# Patient Record
Sex: Female | Born: 1980 | Marital: Married | Smoking: Never smoker
Health system: Southern US, Community
[De-identification: ages and names within clinical notes are randomized; demographics above are authoritative.]

## PROBLEM LIST (undated history)

## (undated) ENCOUNTER — Emergency Department: Discharge status: Absent without leave | Payer: Self-pay | Attending: Emergency Medicine | Admitting: Emergency Medicine

## (undated) DIAGNOSIS — D219 Benign neoplasm of connective and other soft tissue, unspecified: Secondary | ICD-10-CM

## (undated) DIAGNOSIS — R87629 Unspecified abnormal cytological findings in specimens from vagina: Secondary | ICD-10-CM

## (undated) DIAGNOSIS — D62 Acute posthemorrhagic anemia: Secondary | ICD-10-CM

## (undated) DIAGNOSIS — N739 Female pelvic inflammatory disease, unspecified: Secondary | ICD-10-CM

## (undated) HISTORY — DX: Benign neoplasm of connective and other soft tissue, unspecified: D21.9

## (undated) HISTORY — PX: WISDOM TOOTH EXTRACTION: SHX21

## (undated) HISTORY — PX: DILATION AND CURETTAGE OF UTERUS: SHX78

## (undated) HISTORY — DX: Unspecified abnormal cytological findings in specimens from vagina: R87.629

---

## 2010-10-20 ENCOUNTER — Ambulatory Visit
Admission: RE | Admit: 2010-10-20 | Discharge: 2010-10-20 | Disposition: A | Discharge status: Home / Self Care | Payer: Commercial Managed Care - PPO | Source: Ambulatory Visit

## 2010-10-20 ENCOUNTER — Other Ambulatory Visit: Payer: Self-pay

## 2010-10-20 DIAGNOSIS — R7611 Nonspecific reaction to tuberculin skin test without active tuberculosis: Secondary | ICD-10-CM

## 2011-01-04 ENCOUNTER — Other Ambulatory Visit: Payer: Self-pay | Admitting: Infectious Diseases

## 2011-01-04 DIAGNOSIS — R7611 Nonspecific reaction to tuberculin skin test without active tuberculosis: Secondary | ICD-10-CM

## 2012-01-09 ENCOUNTER — Encounter: Payer: Self-pay | Admitting: Obstetrics and Gynecology

## 2012-01-30 ENCOUNTER — Encounter: Payer: Self-pay | Admitting: Obstetrics and Gynecology

## 2012-08-27 DIAGNOSIS — N979 Female infertility, unspecified: Secondary | ICD-10-CM | POA: Insufficient documentation

## 2012-10-22 ENCOUNTER — Other Ambulatory Visit (HOSPITAL_COMMUNITY): Payer: Self-pay | Admitting: Obstetrics and Gynecology

## 2012-10-22 DIAGNOSIS — N979 Female infertility, unspecified: Secondary | ICD-10-CM

## 2012-10-25 ENCOUNTER — Ambulatory Visit (HOSPITAL_COMMUNITY): Payer: Self-pay

## 2012-12-11 ENCOUNTER — Other Ambulatory Visit (HOSPITAL_COMMUNITY): Payer: Self-pay | Admitting: Obstetrics and Gynecology

## 2012-12-11 DIAGNOSIS — N979 Female infertility, unspecified: Secondary | ICD-10-CM

## 2012-12-21 ENCOUNTER — Ambulatory Visit (HOSPITAL_COMMUNITY)
Admission: RE | Admit: 2012-12-21 | Discharge: 2012-12-21 | Disposition: A | Discharge status: Home / Self Care | Payer: 59 | Source: Ambulatory Visit | Attending: Obstetrics and Gynecology | Admitting: Obstetrics and Gynecology

## 2012-12-21 ENCOUNTER — Other Ambulatory Visit (HOSPITAL_COMMUNITY): Payer: Self-pay | Admitting: Obstetrics and Gynecology

## 2012-12-21 DIAGNOSIS — N979 Female infertility, unspecified: Secondary | ICD-10-CM | POA: Insufficient documentation

## 2012-12-21 MED ORDER — IOHEXOL 300 MG/ML  SOLN
10.0000 mL | Freq: Once | INTRAMUSCULAR | Status: AC | PRN
  Administered 2012-12-21: 10 mL

## 2012-12-28 ENCOUNTER — Ambulatory Visit (HOSPITAL_COMMUNITY): Payer: 59

## 2013-05-13 ENCOUNTER — Emergency Department (INDEPENDENT_AMBULATORY_CARE_PROVIDER_SITE_OTHER)
Admission: EM | Admit: 2013-05-13 | Discharge: 2013-05-13 | Disposition: A | Discharge status: Home / Self Care | Payer: 59 | Source: Home / Self Care | Attending: Emergency Medicine | Admitting: Emergency Medicine

## 2013-05-13 ENCOUNTER — Encounter: Payer: Self-pay | Admitting: Emergency Medicine

## 2013-05-13 ENCOUNTER — Other Ambulatory Visit: Payer: Self-pay | Admitting: Emergency Medicine

## 2013-05-13 DIAGNOSIS — B349 Viral infection, unspecified: Secondary | ICD-10-CM

## 2013-05-13 DIAGNOSIS — B9789 Other viral agents as the cause of diseases classified elsewhere: Secondary | ICD-10-CM

## 2013-05-13 LAB — POCT URINALYSIS DIP (MANUAL ENTRY)
Bilirubin, UA: NEGATIVE
Blood, UA: NEGATIVE
Glucose, UA: NEGATIVE
Ketones, POC UA: NEGATIVE
Leukocytes, UA: NEGATIVE
Nitrite, UA: NEGATIVE
Protein Ur, POC: NEGATIVE
Spec Grav, UA: 1.02 (ref 1.005–1.03)
Urobilinogen, UA: 0.2 (ref 0–1)
pH, UA: 7 (ref 5–8)

## 2013-05-13 LAB — POCT CBC W AUTO DIFF (K'VILLE URGENT CARE)

## 2013-05-13 NOTE — ED Notes (Signed)
Fever x 6 days, relieved with tylenol, but returns q 6hrs. Also reports rash on hands, thighs, back, denies itching, pain x 6 days, OB advised her to go to Urgent Care, she is 8.[redacted] weeks pregnant.

## 2013-05-13 NOTE — ED Provider Notes (Addendum)
CSN: [id number]     Arrival date & time 05/13/13  1200 History     First MD Initiated Contact with Patient 05/13/13 1233     Chief Complaint  Patient presents with  . Fever   Patient is a 32 y.o. female presenting with fever. The history is provided by the patient and the spouse.  Fever Max temp prior to arrival:  100.7 Temp source:  Oral Severity:  Moderate Onset quality:  Unable to specify Duration:  5 days Timing:  Intermittent Progression:  Improving Chronicity:  New Relieved by:  Acetaminophen (As directed by her OB) Associated symptoms: chills (although the chills have improved somewhat), headaches (Mild, currently without), myalgias (Mild), nausea (Minimal, but tolerating by mouth  well) and rash   Associated symptoms: no chest pain, no confusion, no congestion, no cough, no diarrhea, no dysuria, no ear pain, no rhinorrhea, no somnolence, no sore throat and no vomiting    She is 8 and a half weeks pregnant. OB is Dr. Juliene Pina at [facility]. Onset of fever and above symptoms 6 days ago. Associated with nonspecific pinpoint rash on hands and thighs and back--Not pruritic. Not painful.-No red streaks. No drainage No known history of tick bite . Denies any bull's-eye type rash. No rash on palms or soles. She saw her OB 3 days ago, she reports normal ultrasound done. UA was done that OB office within normal limits. CBC was normal there, she reports . Her OB advised her to go to urgent care because of the persistent fever. Although minimal nausea, she is tolerating by mouth as well without vomiting or diarrhea or change of bowel habits. No vaginal bleeding.  Her sister is an M.D., who suggested that pt request blood tests for viral titers.  History reviewed. No pertinent past medical history. History reviewed. No pertinent past surgical history. No family history on file. History  Substance Use Topics  . Smoking status: Never Smoker   . Smokeless tobacco: Not on file  .  Alcohol Use: Yes   OB History   Grav Para Term Preterm Abortions TAB SAB Ect Mult Living                 Review of Systems  Constitutional: Positive for fever and chills (although the chills have improved somewhat).  HENT: Negative for ear pain, congestion, sore throat and rhinorrhea.   Respiratory: Negative for cough.   Cardiovascular: Negative for chest pain.  Gastrointestinal: Positive for nausea (Minimal, but tolerating by mouth  well). Negative for vomiting and diarrhea.  Genitourinary: Negative for dysuria.  Musculoskeletal: Positive for myalgias (Mild).  Skin: Positive for rash.  Neurological: Positive for headaches (Mild, currently without).  Psychiatric/Behavioral: Negative for confusion.  All other systems reviewed and are negative.    Allergies  Review of patient's allergies indicates no known allergies.  Home Medications   Current Outpatient Rx  Name  Route  Sig  Dispense  Refill  . Prenatal Vit-Fe Fumarate-FA (MULTIVITAMIN-PRENATAL) 27-0.8 MG TABS tablet   Oral   Take 1 tablet by mouth daily at 12 noon.         . progesterone 200 MG SUPP   Vaginal   Place 200 mg vaginally at bedtime.          BP 109/68  Pulse 85  Temp(Src) 98.8 F (37.1 C) (Oral)  Ht 5\' 4"  (1.626 m)  Wt 127 lb (57.607 kg)  BMI 21.79 kg/m2  SpO2 100% Physical Exam  Nursing note and vitals reviewed.  Constitutional: She is oriented to person, place, and time. She appears well-developed and well-nourished. No distress.  HENT:  Head: Normocephalic and atraumatic.  Right Ear: External ear normal.  Left Ear: External ear normal.  Nose: Nose normal.  Mouth/Throat: Oropharynx is clear and moist. No oropharyngeal exudate.  Eyes: Conjunctivae are normal. Pupils are equal, round, and reactive to light. Right eye exhibits no discharge. Left eye exhibits no discharge. No scleral icterus.  Neck: Normal range of motion. Neck supple.  Cardiovascular: Normal rate, regular rhythm and normal  heart sounds.  Exam reveals no gallop and no friction rub.   No murmur heard. Pulmonary/Chest: Effort normal and breath sounds normal. No respiratory distress. She has no wheezes. She has no rales.  Abdominal: Soft. Bowel sounds are normal. She exhibits no mass. There is no tenderness.  Musculoskeletal: Normal range of motion. She exhibits no edema and no tenderness.  Lymphadenopathy:    She has no cervical adenopathy.  Neurological: She is alert and oriented to person, place, and time. No cranial nerve deficit.  Skin: Skin is warm and dry. Rash (there are several scattered punctate individual red papules on extremities and trunk. These are very small, pinpoint without vesicles or fluctuance or induration.Marland Kitchen--The palms and soles are spared.) noted. She is not diaphoretic.  Psychiatric: She has a normal mood and affect.   Skin: There is no bull's-eye type rash. ED Course   Procedures (including critical care time)  Labs Reviewed  INFLUENZA PANEL BY PCR  TORCH-IGM(TOXO/ RUB/ CMV/ HSV) W TITER  ROCKY MTN SPOTTED FVR AB, IGM-BLOOD  B. BURGDORFI ANTIBODIES  POCT CBC W AUTO DIFF ([facility])  POCT URINALYSIS DIPSTICK   No results found. 1. Acute viral syndrome     MDM  Today, CBC normal with WBC 7.5 and platelets 179,000. Hemoglobin is lower limits of normal at 10.1, consistent with her pre-pregnancy hemoglobin in the 11 range. No evidence of any acute blood loss. Repeated UA today, normal. Discussed with patient and husband that likely diagnosis is acute viral syndrome. Much less likely, could be very mild RMSF or Lyme's, but she does not have a complete history or findings on exam. Clinically, no evidence of any bacterial infection on exam . Her symptoms are improving the past day with less fever and she's feeling somewhat better today. Risks, benefits, alternatives discussed. We discussed workup and treatment options. Influenza panel, TORCH titers, RMSF and Lyme's IgM  antibody tests. She agrees that antibiotics are not indicated at this time. We discussed symptomatic care. Judicious use of Tylenol as approved by her OB when necessary fever. Call or recheck if any new or worsening symptoms, or sooner if any severe symptoms.-Red flags discussed. She and husband voiced understanding and agreement with above plans. Followup with OB for ongoing prenatal care, as well as PCP when necessary.  Lajean Manes, MD 05/13/13 1656  Lajean Manes, MD 05/15/13 [phone]

## 2013-05-14 LAB — OTHER SOLSTAS TEST
Inflenza A Ag: NEGATIVE
Influenza B Ag: NEGATIVE
Source-INFBD: 0

## 2013-05-14 LAB — B. BURGDORFI ANTIBODIES: B burgdorferi Ab IgG+IgM: 1.01 {ISR} — ABNORMAL HIGH

## 2013-05-14 LAB — ROCKY MTN SPOTTED FVR AB, IGM-BLOOD: ROCKY MTN SPOTTED FEVER, IGM: 0.18 IV

## 2013-05-15 LAB — B. BURGDORFI ANTIBODIES BY WB
B burgdorferi IgG Abs (IB): NEGATIVE
B burgdorferi IgM Abs (IB): NEGATIVE

## 2013-05-16 LAB — INFLUENZA A & B PCR
Influenza A: NOT DETECTED
Influenza B: NOT DETECTED

## 2013-05-17 LAB — TORCH-IGM(TOXO/ RUB/ CMV/ HSV) W TITER
CMV IgM: <0.2
HSV 1 IgM Abs: NEGATIVE
HSV 2 IgM Abs: NEGATIVE
RPR Screen: NONREACTIVE
Rubella IgM Index: <0.9 (ref <0.90)
Toxoplasma IgM Ab: NEGATIVE

## 2013-05-19 ENCOUNTER — Telehealth: Payer: Self-pay | Admitting: Emergency Medicine

## 2013-05-21 LAB — OB RESULTS CONSOLE HEPATITIS B SURFACE ANTIGEN: Hepatitis B Surface Ag: NEGATIVE

## 2013-05-21 LAB — OB RESULTS CONSOLE ANTIBODY SCREEN: Antibody Screen: NEGATIVE

## 2013-05-21 LAB — OB RESULTS CONSOLE ABO/RH: RH Type: POSITIVE

## 2013-05-21 LAB — OB RESULTS CONSOLE RUBELLA ANTIBODY, IGM: Rubella: IMMUNE

## 2013-05-21 LAB — OB RESULTS CONSOLE RPR: RPR: NONREACTIVE

## 2013-05-21 LAB — OB RESULTS CONSOLE GC/CHLAMYDIA
Chlamydia: NEGATIVE
Gonorrhea: NEGATIVE

## 2013-05-21 LAB — OB RESULTS CONSOLE HIV ANTIBODY (ROUTINE TESTING): HIV: NONREACTIVE

## 2013-09-19 NOTE — L&D Delivery Note (Signed)
Delivery Note At 3:19 AM a viable and healthy female was delivered via Vaginal, Spontaneous Delivery (Presentation: ; Occiput Anterior).  APGAR: 7, 8; weight pending.   Placenta status: Intact, Spontaneous.  meconium stained not sent Cord: 3 vessels with the following complications: Long. Cord looped around both foot/leg  Cord pH: none  Anesthesia: Epidural  Episiotomy: None Lacerations:  left periurethral;Sulcus Suture Repair: 3.0 chromic Est. Blood Loss (mL): 250  Mom to postpartum.  Baby to Couplet care / Skin to Skin.  Mekaila Tarnow A 12/15/2013, 3:57 AM

## 2013-11-22 LAB — OB RESULTS CONSOLE GBS: GBS: NEGATIVE

## 2013-12-14 ENCOUNTER — Encounter (HOSPITAL_COMMUNITY): Payer: 59 | Admitting: Anesthesiology

## 2013-12-14 ENCOUNTER — Inpatient Hospital Stay (HOSPITAL_COMMUNITY): Payer: 59 | Admitting: Anesthesiology

## 2013-12-14 ENCOUNTER — Encounter (HOSPITAL_COMMUNITY): Payer: Self-pay | Admitting: Obstetrics and Gynecology

## 2013-12-14 ENCOUNTER — Inpatient Hospital Stay (HOSPITAL_COMMUNITY)
Admission: AD | Admit: 2013-12-14 | Discharge: 2013-12-14 | Disposition: A | Discharge status: Home / Self Care | Payer: 59 | Source: Ambulatory Visit | Attending: Obstetrics and Gynecology | Admitting: Obstetrics and Gynecology

## 2013-12-14 ENCOUNTER — Inpatient Hospital Stay (HOSPITAL_COMMUNITY)
Admission: AD | Admit: 2013-12-14 | Discharge: 2013-12-17 | DRG: 775 | Disposition: A | Discharge status: Home / Self Care | Payer: 59 | Source: Ambulatory Visit | Attending: Obstetrics and Gynecology | Admitting: Obstetrics and Gynecology

## 2013-12-14 ENCOUNTER — Encounter (HOSPITAL_COMMUNITY): Payer: Self-pay

## 2013-12-14 DIAGNOSIS — O9903 Anemia complicating the puerperium: Secondary | ICD-10-CM | POA: Diagnosis not present

## 2013-12-14 DIAGNOSIS — D259 Leiomyoma of uterus, unspecified: Secondary | ICD-10-CM | POA: Diagnosis present

## 2013-12-14 DIAGNOSIS — IMO0001 Reserved for inherently not codable concepts without codable children: Secondary | ICD-10-CM

## 2013-12-14 DIAGNOSIS — D62 Acute posthemorrhagic anemia: Secondary | ICD-10-CM

## 2013-12-14 DIAGNOSIS — O34599 Maternal care for other abnormalities of gravid uterus, unspecified trimester: Secondary | ICD-10-CM | POA: Diagnosis present

## 2013-12-14 DIAGNOSIS — D4959 Neoplasm of unspecified behavior of other genitourinary organ: Secondary | ICD-10-CM | POA: Diagnosis present

## 2013-12-14 DIAGNOSIS — M549 Dorsalgia, unspecified: Secondary | ICD-10-CM | POA: Insufficient documentation

## 2013-12-14 DIAGNOSIS — O479 False labor, unspecified: Secondary | ICD-10-CM | POA: Insufficient documentation

## 2013-12-14 DIAGNOSIS — O368390 Maternal care for abnormalities of the fetal heart rate or rhythm, unspecified trimester, not applicable or unspecified: Secondary | ICD-10-CM | POA: Diagnosis not present

## 2013-12-14 DIAGNOSIS — O3410 Maternal care for benign tumor of corpus uteri, unspecified trimester: Secondary | ICD-10-CM

## 2013-12-14 HISTORY — DX: Acute posthemorrhagic anemia: D62

## 2013-12-14 LAB — TYPE AND SCREEN
ABO/RH(D): B POS
Antibody Screen: NEGATIVE

## 2013-12-14 LAB — CBC
HCT: 34.9 % — ABNORMAL LOW (ref 36.0–46.0)
Hemoglobin: 11.5 g/dL — ABNORMAL LOW (ref 12.0–15.0)
MCH: 27 pg (ref 26.0–34.0)
MCHC: 33 g/dL (ref 30.0–36.0)
MCV: 81.9 fL (ref 78.0–100.0)
Platelets: 178 10*3/uL (ref 150–400)
RBC: 4.26 MIL/uL (ref 3.87–5.11)
RDW: 15.2 % (ref 11.5–15.5)
WBC: 16.4 10*3/uL — ABNORMAL HIGH (ref 4.0–10.5)

## 2013-12-14 MED ORDER — ONDANSETRON HCL 4 MG/2ML IJ SOLN: 4.0000 mg | Freq: Four times a day (QID) | INTRAMUSCULAR | Status: DC | PRN

## 2013-12-14 MED ORDER — LACTATED RINGERS IV SOLN
INTRAVENOUS | Status: DC
  Administered 2013-12-14 – 2013-12-15 (×2): via INTRAVENOUS

## 2013-12-14 MED ORDER — LACTATED RINGERS IV SOLN: 500.0000 mL | INTRAVENOUS | Status: DC | PRN

## 2013-12-14 MED ORDER — LACTATED RINGERS IV SOLN
500.0000 mL | Freq: Once | INTRAVENOUS | Status: AC
  Administered 2013-12-14: 500 mL via INTRAVENOUS

## 2013-12-14 MED ORDER — IBUPROFEN 600 MG PO TABS: 600.0000 mg | ORAL_TABLET | Freq: Four times a day (QID) | ORAL | Status: DC | PRN

## 2013-12-14 MED ORDER — FENTANYL 2.5 MCG/ML BUPIVACAINE 1/10 % EPIDURAL INFUSION (WH - ANES)
14.0000 mL/h | INTRAMUSCULAR | Status: DC | PRN
  Administered 2013-12-14: 14 mL/h via EPIDURAL
  Filled 2013-12-14 (×2): qty 125

## 2013-12-14 MED ORDER — TERBUTALINE SULFATE 1 MG/ML IJ SOLN: 0.2500 mg | Freq: Once | INTRAMUSCULAR | Status: AC | PRN

## 2013-12-14 MED ORDER — OXYCODONE-ACETAMINOPHEN 5-325 MG PO TABS: 1.0000 | ORAL_TABLET | ORAL | Status: DC | PRN

## 2013-12-14 MED ORDER — LIDOCAINE HCL (PF) 1 % IJ SOLN
30.0000 mL | INTRAMUSCULAR | Status: DC | PRN
  Filled 2013-12-14: qty 30

## 2013-12-14 MED ORDER — PHENYLEPHRINE 40 MCG/ML (10ML) SYRINGE FOR IV PUSH (FOR BLOOD PRESSURE SUPPORT)
80.0000 ug | PREFILLED_SYRINGE | INTRAVENOUS | Status: DC | PRN
  Filled 2013-12-14: qty 10
  Filled 2013-12-14: qty 2

## 2013-12-14 MED ORDER — OXYTOCIN 40 UNITS IN LACTATED RINGERS INFUSION - SIMPLE MED
1.0000 m[IU]/min | INTRAVENOUS | Status: DC
  Administered 2013-12-14: 2 m[IU]/min via INTRAVENOUS

## 2013-12-14 MED ORDER — LIDOCAINE HCL (PF) 1 % IJ SOLN
INTRAMUSCULAR | Status: DC | PRN
  Administered 2013-12-14: 5 mL

## 2013-12-14 MED ORDER — OXYTOCIN BOLUS FROM INFUSION
500.0000 mL | INTRAVENOUS | Status: DC
  Administered 2013-12-15: 500 mL via INTRAVENOUS

## 2013-12-14 MED ORDER — ACETAMINOPHEN 325 MG PO TABS: 650.0000 mg | ORAL_TABLET | ORAL | Status: DC | PRN

## 2013-12-14 MED ORDER — LACTATED RINGERS IV SOLN
INTRAVENOUS | Status: DC
Start: 2013-12-14 — End: 2013-12-15

## 2013-12-14 MED ORDER — OXYTOCIN 10 UNIT/ML IJ SOLN: 10.0000 [IU] | Freq: Once | INTRAMUSCULAR | Status: DC

## 2013-12-14 MED ORDER — EPHEDRINE 5 MG/ML INJ
10.0000 mg | INTRAVENOUS | Status: DC | PRN
  Filled 2013-12-14: qty 2

## 2013-12-14 MED ORDER — OXYTOCIN 40 UNITS IN LACTATED RINGERS INFUSION - SIMPLE MED
62.5000 mL/h | INTRAVENOUS | Status: DC
Start: 2013-12-14 — End: 2013-12-15
  Filled 2013-12-14: qty 1000

## 2013-12-14 MED ORDER — CITRIC ACID-SODIUM CITRATE 334-500 MG/5ML PO SOLN: 30.0000 mL | ORAL | Status: DC | PRN

## 2013-12-14 MED ORDER — DIPHENHYDRAMINE HCL 50 MG/ML IJ SOLN: 12.5000 mg | INTRAMUSCULAR | Status: DC | PRN

## 2013-12-14 MED ORDER — EPHEDRINE 5 MG/ML INJ
10.0000 mg | INTRAVENOUS | Status: DC | PRN
  Filled 2013-12-14: qty 4
  Filled 2013-12-14: qty 2

## 2013-12-14 MED ORDER — PHENYLEPHRINE 40 MCG/ML (10ML) SYRINGE FOR IV PUSH (FOR BLOOD PRESSURE SUPPORT)
80.0000 ug | PREFILLED_SYRINGE | INTRAVENOUS | Status: DC | PRN
  Filled 2013-12-14: qty 2

## 2013-12-14 MED ORDER — OXYCODONE-ACETAMINOPHEN 5-325 MG PO TABS
2.0000 | ORAL_TABLET | Freq: Once | ORAL | Status: AC
  Administered 2013-12-14: 2 via ORAL
  Filled 2013-12-14: qty 2

## 2013-12-14 NOTE — Progress Notes (Signed)
Notified of pt arrival in MAU. Given instructions to labor eval and call dr. cousins

## 2013-12-14 NOTE — MAU Note (Signed)
Patient states she is having contractions every 6 minutes with bloody show. Reports good fetal movement.

## 2013-12-14 NOTE — Anesthesia Preprocedure Evaluation (Signed)

## 2013-12-14 NOTE — Progress Notes (Signed)
Dr. Garwin Brothers requests patient be checked again and call back before pain medication given

## 2013-12-14 NOTE — Progress Notes (Signed)
Erabella Kuipers is a 33 y.o. G3P0020 at [redacted]w[redacted]d by LMP admitted for active labor  Subjective: Chief Complaint  Patient presents with  . Contractions  no complaint Epidural  Objective: BP 114/68  Pulse 91  Temp(Src) 98.3 F (36.8 C) (Oral)  Resp 18  SpO2 98%  LMP 12/12/2012      FHT:  FHR: 130-135 bpm, variability: minimal ,  accelerations:  Present,  decelerations:  Present rare variable UC:   irregular, every 2-5 minutes SVE:   4 cm dilated, 90% effaced, -1 station, asynclitic ? LOP  AROM moderate meconium. IUPC placed  Labs: Lab Results  Component Value Date   WBC 16.4* 12/14/2013   HGB 11.5* 12/14/2013   HCT 34.9* 12/14/2013   MCV 81.9 12/14/2013   PLT 178 12/14/2013    Assessment / Plan: Arrest in active phase of labor Term gestation Fibroid uterus P) exaggerated right sims. Amnioinfusion. Pitocin augmentation. Foley  Anticipated MOD:  unknown  Birney Belshe A 12/14/2013, 10:31 PM

## 2013-12-14 NOTE — Progress Notes (Signed)
Notified of pt arrival in MAU and cervical exam. Received orders to admit pt.

## 2013-12-14 NOTE — MAU Note (Signed)
Pt presents with contractions worse than earlier when she was in MAU. Denies leaking of fluid. Still reporting bloody show

## 2013-12-14 NOTE — H&P (Signed)
Sydney Little is a 33 y.o. female re-presenting at term in active labor. Intact membrane. GBS cx neg  Maternal Medical History:  Reason for admission: Contractions.   Contractions: Frequency: irregular.    Fetal activity: Perceived fetal activity is normal.    Prenatal complications: no prenatal complications Prenatal Complications - Diabetes: none.    OB History   Grav Para Term Preterm Abortions TAB SAB Ect Mult Living   3 0    2    0     No past medical history on file. No past surgical history on file. Family History: family history is not on file. Social History:  reports that she has never smoked. She does not have any smokeless tobacco history on file. She reports that she drinks alcohol. She reports that she does not use illicit drugs.   Prenatal Transfer Tool  Maternal Diabetes: No Genetic Screening: Normal Maternal Ultrasounds/Referrals: Normal Fetal Ultrasounds or other Referrals:  None Maternal Substance Abuse:  No Significant Maternal Medications:  None Significant Maternal Lab Results:  Lab values include: Group B Strep negative Other Comments:  None  ROS neg  Dilation: 4 Effacement (%): 100 Station: 0 Exam by:: Sydney Peers RN Last menstrual period 12/12/2012. Exam Physical Exam  Constitutional: She is oriented to person, place, and time. She appears well-nourished.  HENT:  Head: Atraumatic.  Eyes: EOM are normal.  Neck: Neck supple.  Cardiovascular: Normal rate and regular rhythm.   Respiratory: Breath sounds normal.  GI: Soft.  Musculoskeletal: She exhibits no edema.  Neurological: She is alert and oriented to person, place, and time.  Skin: Skin is warm and dry.  Psychiatric: She has a normal mood and affect.    Prenatal labs: ABO, Rh: B/Positive/-- (09/02 0000) Antibody: Negative (09/02 0000) Rubella: Immune (09/02 0000) RPR: Nonreactive (09/02 0000)  HBsAg: Negative (09/02 0000)  HIV: Non-reactive (09/02 0000)  GBS: Negative  (03/06 0000)   Tracing: baseline 135 (+) accel Ctx q 2-5 mins  Assessment/Plan: Active labor Term gestation Fibroid uterus P) admit . Routine labs. Epidural. Amniotomy prn. Pitocin prn   Sydney Little A 12/14/2013, 8:21 PM

## 2013-12-14 NOTE — Progress Notes (Signed)
Notified of pt vaginal exam. Requested to let pt walk for 1 hour and recheck. No orders for pain medication

## 2013-12-14 NOTE — MAU Provider Note (Signed)
History     Chief Complaint  Patient presents with  . Labor Eval   CC: ctx HPI: 33 yo G3P0020 WF @ 39 2/7 weeks for labor evaluation. Intact membrane. GBS cx negative. Known 10.8 cm right fibroid. Last sono 3/27 6lb 14 oz( 33%) nl fluid. AC ( 16%). C/o back pain radiating down leg  OB History   Grav Para Term Preterm Abortions TAB SAB Ect Mult Living   3  0   2    00      History reviewed. No pertinent past medical history.  History reviewed. No pertinent past surgical history. TAB x 2 History reviewed. No pertinent family history.  History  Substance Use Topics  . Smoking status: Never Smoker   . Smokeless tobacco: Not on file  . Alcohol Use: Yes    Allergies: No Known Allergies  Prescriptions prior to admission  Medication Sig Dispense Refill  . Docusate Sodium (COLACE PO) Take 2 tablets by mouth at bedtime as needed.      . ferrous fumarate (FERRO-SEQUELS) 50 MG CR tablet Take 50 mg by mouth 3 (three) times daily with meals.      Marland Kitchen loratadine (CLARITIN) 10 MG tablet Take 10 mg by mouth daily as needed for allergies.      . Prenatal Vit-Fe Fumarate-FA (MULTIVITAMIN-PRENATAL) 27-0.8 MG TABS tablet Take 1 tablet by mouth daily at 12 noon.         Physical Exam   Blood pressure 143/75, pulse 69, temperature 98.1 F (36.7 C), temperature source Oral, resp. rate 18, height 5\' 5"  (1.651 m), weight 73.029 kg (161 lb), last menstrual period 12/12/2012.  General appearance: alert, cooperative and no distress Abdomen: gravid mild palp ctx Pelvic: external genitalia normal and 2/90/-2 posterior requiring sitting on fist for exam Extremities: no edema, redness or tenderness in the calves or thighs   Tracing: baseline 130 (+) accels to 150's. Had a 2 min decel associated with exam by RN then reactive and reassuring Ctx q 2-5 mins ED Course   Prodromal labor GBS cx neg  P) given option to continue walking given min change in 4 hr or go home with 2 percocet. Labor prec  given Pt opts to continue walking( informed not in labor as yet).. If no cervical change, d/c home MDM   Jawuan Robb A, MD 4:09 PM 12/14/2013

## 2013-12-14 NOTE — Progress Notes (Signed)
Paged because pt is requesting pain medication

## 2013-12-14 NOTE — Progress Notes (Signed)
Paged to notify of pt cervical exam and FHR deceleration

## 2013-12-14 NOTE — Progress Notes (Signed)
Notified of pt cervical exam and FHR deceleration. Dr. Garwin Brothers reviewed tracing and requests pt be kept on the monitor. Will come see pt

## 2013-12-14 NOTE — Progress Notes (Signed)
Notified of pt request for percocet and crying in the hallway. Received orders for 2 percocet and to discharge patient.

## 2013-12-14 NOTE — Discharge Instructions (Signed)
Third Trimester of Pregnancy °The third trimester is from week 29 through week 42, months 7 through 9. The third trimester is a time when the fetus is growing rapidly. At the end of the ninth month, the fetus is about 20 inches in length and weighs 6 10 pounds.  °BODY CHANGES °Your body goes through many changes during pregnancy. The changes vary from woman to woman.  °· Your weight will continue to increase. You can expect to gain 25 35 pounds (11 16 kg) by the end of the pregnancy. °· You may begin to get stretch marks on your hips, abdomen, and breasts. °· You may urinate more often because the fetus is moving lower into your pelvis and pressing on your bladder. °· You may develop or continue to have heartburn as a result of your pregnancy. °· You may develop constipation because certain hormones are causing the muscles that push waste through your intestines to slow down. °· You may develop hemorrhoids or swollen, bulging veins (varicose veins). °· You may have pelvic pain because of the weight gain and pregnancy hormones relaxing your joints between the bones in your pelvis. Back aches may result from over exertion of the muscles supporting your posture. °· Your breasts will continue to grow and be tender. A yellow discharge may leak from your breasts called colostrum. °· Your belly button may stick out. °· You may feel short of breath because of your expanding uterus. °· You may notice the fetus "dropping," or moving lower in your abdomen. °· You may have a bloody mucus discharge. This usually occurs a few days to a week before labor begins. °· Your cervix becomes thin and soft (effaced) near your due date. °WHAT TO EXPECT AT YOUR PRENATAL EXAMS  °You will have prenatal exams every 2 weeks until week 36. Then, you will have weekly prenatal exams. During a routine prenatal visit: °· You will be weighed to make sure you and the fetus are growing normally. °· Your blood pressure is taken. °· Your abdomen will be  measured to track your baby's growth. °· The fetal heartbeat will be listened to. °· Any test results from the previous visit will be discussed. °· You may have a cervical check near your due date to see if you have effaced. °At around 36 weeks, your caregiver will check your cervix. At the same time, your caregiver will also perform a test on the secretions of the vaginal tissue. This test is to determine if a type of bacteria, Group B streptococcus, is present. Your caregiver will explain this further. °Your caregiver may ask you: °· What your birth plan is. °· How you are feeling. °· If you are feeling the baby move. °· If you have had any abnormal symptoms, such as leaking fluid, bleeding, severe headaches, or abdominal cramping. °· If you have any questions. °Other tests or screenings that may be performed during your third trimester include: °· Blood tests that check for low iron levels (anemia). °· Fetal testing to check the health, activity level, and growth of the fetus. Testing is done if you have certain medical conditions or if there are problems during the pregnancy. °FALSE LABOR °You may feel small, irregular contractions that eventually go away. These are called Braxton Hicks contractions, or false labor. Contractions may last for hours, days, or even weeks before true labor sets in. If contractions come at regular intervals, intensify, or become painful, it is best to be seen by your caregiver.  °  SIGNS OF LABOR  °· Menstrual-like cramps. °· Contractions that are 5 minutes apart or less. °· Contractions that start on the top of the uterus and spread down to the lower abdomen and back. °· A sense of increased pelvic pressure or back pain. °· A watery or bloody mucus discharge that comes from the vagina. °If you have any of these signs before the 37th week of pregnancy, call your caregiver right away. You need to go to the hospital to get checked immediately. °HOME CARE INSTRUCTIONS  °· Avoid all  smoking, herbs, alcohol, and unprescribed drugs. These chemicals affect the formation and growth of the baby. °· Follow your caregiver's instructions regarding medicine use. There are medicines that are either safe or unsafe to take during pregnancy. °· Exercise only as directed by your caregiver. Experiencing uterine cramps is a good sign to stop exercising. °· Continue to eat regular, healthy meals. °· Wear a good support bra for breast tenderness. °· Do not use hot tubs, steam rooms, or saunas. °· Wear your seat belt at all times when driving. °· Avoid raw meat, uncooked cheese, cat litter boxes, and soil used by cats. These carry germs that can cause birth defects in the baby. °· Take your prenatal vitamins. °· Try taking a stool softener (if your caregiver approves) if you develop constipation. Eat more high-fiber foods, such as fresh vegetables or fruit and whole grains. Drink plenty of fluids to keep your urine clear or pale yellow. °· Take warm sitz baths to soothe any pain or discomfort caused by hemorrhoids. Use hemorrhoid cream if your caregiver approves. °· If you develop varicose veins, wear support hose. Elevate your feet for 15 minutes, 3 4 times a day. Limit salt in your diet. °· Avoid heavy lifting, wear low heal shoes, and practice good posture. °· Rest a lot with your legs elevated if you have leg cramps or low back pain. °· Visit your dentist if you have not gone during your pregnancy. Use a soft toothbrush to brush your teeth and be gentle when you floss. °· A sexual relationship may be continued unless your caregiver directs you otherwise. °· Do not travel far distances unless it is absolutely necessary and only with the approval of your caregiver. °· Take prenatal classes to understand, practice, and ask questions about the labor and delivery. °· Make a trial run to the hospital. °· Pack your hospital bag. °· Prepare the baby's nursery. °· Continue to go to all your prenatal visits as directed  by your caregiver. °SEEK MEDICAL CARE IF: °· You are unsure if you are in labor or if your water has broken. °· You have dizziness. °· You have mild pelvic cramps, pelvic pressure, or nagging pain in your abdominal area. °· You have persistent nausea, vomiting, or diarrhea. °· You have a bad smelling vaginal discharge. °· You have pain with urination. °SEEK IMMEDIATE MEDICAL CARE IF:  °· You have a fever. °· You are leaking fluid from your vagina. °· You have spotting or bleeding from your vagina. °· You have severe abdominal cramping or pain. °· You have rapid weight loss or gain. °· You have shortness of breath with chest pain. °· You notice sudden or extreme swelling of your face, hands, ankles, feet, or legs. °· You have not felt your baby move in over an hour. °· You have severe headaches that do not go away with medicine. °· You have vision changes. °Document Released: 08/30/2001 Document Revised: 05/08/2013 Document Reviewed:   You have severe abdominal cramping or pain.   You have rapid weight loss or gain.   You have shortness of breath with chest pain.   You notice sudden or extreme swelling of your face, hands, ankles, feet, or legs.   You have not felt your baby move in over an hour.   You have severe headaches that do not go away with medicine.   You have vision changes.  Document Released: 08/30/2001 Document Revised: 05/08/2013 Document Reviewed: 11/06/2012  [employer] Patient Information 2014 [employer]

## 2013-12-14 NOTE — MAU Note (Signed)
Pt presents complaining of contractions every 5-6 minutes that started at 0530am. Complains of bloody show and some mucous discharge. Denies leaking of fluid

## 2013-12-14 NOTE — Progress Notes (Signed)
Informed patient tolerating left exaggerated sims only, baby had lates and prolonged decel. Amnioinfusion running, oxygen on, iv bolus given.

## 2013-12-14 NOTE — Anesthesia Procedure Notes (Signed)
Epidural Patient location during procedure: OB Start time: 12/14/2013 9:09 PM  Staffing Anesthesiologist: Rudean Curt Performed by: anesthesiologist   Preanesthetic Checklist Completed: patient identified, site marked, surgical consent, pre-op evaluation, timeout performed, IV checked, risks and benefits discussed and monitors and equipment checked  Epidural Patient position: sitting Prep: site prepped and draped and DuraPrep Patient monitoring: continuous pulse ox and blood pressure Approach: midline Injection technique: LOR air  Needle:  Needle type: Tuohy  Needle gauge: 17 G Needle length: 9 cm and 9 Needle insertion depth: 5 cm cm Catheter type: closed end flexible Catheter size: 19 Gauge Catheter at skin depth: 10 cm Test dose: negative  Assessment Events: blood not aspirated, injection not painful, no injection resistance, negative IV test and no paresthesia  Additional Notes Patient identified.  Risk benefits discussed including failed block, incomplete pain control, headache, nerve damage, paralysis, blood pressure changes, nausea, vomiting, reactions to medication both toxic or allergic, and postpartum back pain.  Patient expressed understanding and wished to proceed.  All questions were answered.  Sterile technique used throughout procedure and epidural site dressed with sterile barrier dressing. No paresthesia or other complications noted.The patient did not experience any signs of intravascular injection such as tinnitus or metallic taste in mouth nor signs of intrathecal spread such as rapid motor block. Please see nursing notes for vital signs.

## 2013-12-15 ENCOUNTER — Encounter (HOSPITAL_COMMUNITY): Payer: Self-pay | Admitting: *Deleted

## 2013-12-15 LAB — RPR: RPR Ser Ql: NONREACTIVE

## 2013-12-15 LAB — ABO/RH: ABO/RH(D): B POS

## 2013-12-15 MED ORDER — ONDANSETRON HCL 4 MG PO TABS
4.0000 mg | ORAL_TABLET | ORAL | Status: DC | PRN
Start: 2013-12-15 — End: 2013-12-17

## 2013-12-15 MED ORDER — BENZOCAINE-MENTHOL 20-0.5 % EX AERO
1.0000 "application " | INHALATION_SPRAY | CUTANEOUS | Status: DC | PRN
  Filled 2013-12-15 (×2): qty 56

## 2013-12-15 MED ORDER — IBUPROFEN 600 MG PO TABS
600.0000 mg | ORAL_TABLET | Freq: Four times a day (QID) | ORAL | Status: DC
  Administered 2013-12-15 – 2013-12-17 (×8): 600 mg via ORAL
  Filled 2013-12-15 (×8): qty 1

## 2013-12-15 MED ORDER — ONDANSETRON HCL 4 MG/2ML IJ SOLN: 4.0000 mg | INTRAMUSCULAR | Status: DC | PRN

## 2013-12-15 MED ORDER — OXYCODONE-ACETAMINOPHEN 5-325 MG PO TABS: 1.0000 | ORAL_TABLET | ORAL | Status: DC | PRN

## 2013-12-15 MED ORDER — DIBUCAINE 1 % RE OINT
1.0000 "application " | TOPICAL_OINTMENT | RECTAL | Status: DC | PRN
  Filled 2013-12-15: qty 28

## 2013-12-15 MED ORDER — SENNOSIDES-DOCUSATE SODIUM 8.6-50 MG PO TABS
2.0000 | ORAL_TABLET | ORAL | Status: DC
  Administered 2013-12-15 – 2013-12-17 (×2): 2 via ORAL
  Filled 2013-12-15 (×2): qty 2

## 2013-12-15 MED ORDER — WITCH HAZEL-GLYCERIN EX PADS: 1.0000 "application " | MEDICATED_PAD | CUTANEOUS | Status: DC | PRN

## 2013-12-15 MED ORDER — DIPHENHYDRAMINE HCL 25 MG PO CAPS: 25.0000 mg | ORAL_CAPSULE | Freq: Four times a day (QID) | ORAL | Status: DC | PRN

## 2013-12-15 MED ORDER — SIMETHICONE 80 MG PO CHEW: 80.0000 mg | CHEWABLE_TABLET | ORAL | Status: DC | PRN

## 2013-12-15 MED ORDER — PRENATAL MULTIVITAMIN CH
1.0000 | ORAL_TABLET | Freq: Every day | ORAL | Status: DC
  Administered 2013-12-15 – 2013-12-16 (×2): 1 via ORAL
  Filled 2013-12-15 (×2): qty 1

## 2013-12-15 MED ORDER — FERROUS SULFATE 325 (65 FE) MG PO TABS
325.0000 mg | ORAL_TABLET | Freq: Two times a day (BID) | ORAL | Status: DC
  Filled 2013-12-15 (×2): qty 1

## 2013-12-15 MED ORDER — ZOLPIDEM TARTRATE 5 MG PO TABS: 5.0000 mg | ORAL_TABLET | Freq: Every evening | ORAL | Status: DC | PRN

## 2013-12-15 MED ORDER — LANOLIN HYDROUS EX OINT: TOPICAL_OINTMENT | CUTANEOUS | Status: DC | PRN

## 2013-12-15 NOTE — Anesthesia Postprocedure Evaluation (Signed)
  Anesthesia Post-op Note  Patient: Sydney Little  Procedure(s) Performed: * No procedures listed *  Patient Location: Mother/Baby  Anesthesia Type:Epidural  Level of Consciousness: awake and alert   Airway and Oxygen Therapy: Patient Spontanous Breathing  Post-op Pain: mild  Post-op Assessment: Patient's Cardiovascular Status Stable, Respiratory Function Stable, No signs of Nausea or vomiting, Pain level controlled, No headache, No residual numbness and No residual motor weakness  Post-op Vital Signs: Stable  Complications: No apparent anesthesia complications

## 2013-12-15 NOTE — Progress Notes (Signed)
Informed dr. Garwin Brothers of SVE , patient not feeling pressure to push. Informed still having some lates and variables.Ordered to labor down.

## 2013-12-15 NOTE — Progress Notes (Signed)
Patient ID: Sydney Little, female   DOB: 06-13-1981, 33 y.o.   MRN: [id number] PPD # 0 SVD  S:  Reports feeling well             Tolerating po/ No nausea or vomiting             Bleeding is light             Pain controlled with ibuprofen (OTC)             Up ad lib / ambulatory / voiding without difficulties / (+) Flatus   Newborn  Information for the patient's newborn:  Sydney Little [[id number]]  female  breast feeding  / Circumcision planning   O:  A & O x 3, in no apparent distress              VS:  Filed Vitals:   12/15/13 0438 12/15/13 0502 12/15/13 0550 12/15/13 0735  BP: 123/67 117/64 117/62 107/55  Pulse: 77 76 94 93  Temp:   97.8 F (36.6 C) 98.7 F (37.1 C)  TempSrc:   Oral Oral  Resp: 18 18  18   SpO2:   99%     LABS:  Recent Labs  12/14/13 2011  WBC 16.4*  HGB 11.5*  HCT 34.9*  PLT 178    Blood type: --/--/B POS, B POS (03/28 2010)  Rubella: Immune (09/02 0000)   I&O: I/O last 3 completed shifts: In: -  Out: 750 [Urine:500; Blood:250]             Lungs: Clear and unlabored  Heart: regular rate and rhythm / no murmurs  Abdomen: soft, non-tender, non-distended, normal bowel sounds             Fundus: firm, non-tender, U-1  Perineum: repair healing well, mild edema - ice pack in place  Lochia: minimal  Extremities: no edema, no calf pain or tenderness, no Homans    A/P: PPD # 0  33 y.o., [id number]   Principal Problem:    Postpartum care following vaginal delivery (3/29)  Active Problems:    Active labor   Doing well - stable status  Routine post partum orders  Anticipate discharge tomorrow    Sydney Little, M, MSN, CNM 12/15/2013, 9:45 AM

## 2013-12-15 NOTE — Progress Notes (Signed)
S: comfortable  O: Epidural Pitocin off Complete since 12:50 am  VE: fully/+2 station   Tracing reviewed. Baseline 130 (+) accel (+) early decel Ctx spaced q 5mins  IMP: Complete P) restart pitocin. Start pushing

## 2013-12-16 LAB — CBC
HCT: 31.2 % — ABNORMAL LOW (ref 36.0–46.0)
Hemoglobin: 10 g/dL — ABNORMAL LOW (ref 12.0–15.0)
MCH: 26.6 pg (ref 26.0–34.0)
MCHC: 32.1 g/dL (ref 30.0–36.0)
MCV: 83 fL (ref 78.0–100.0)
Platelets: 152 10*3/uL (ref 150–400)
RBC: 3.76 MIL/uL — ABNORMAL LOW (ref 3.87–5.11)
RDW: 15.3 % (ref 11.5–15.5)
WBC: 16 10*3/uL — ABNORMAL HIGH (ref 4.0–10.5)

## 2013-12-16 NOTE — Lactation Note (Signed)
This note was copied from the chart of [place]. Lactation Consultation Note Follow up visit at 78 hours of age.  Parents are attempting feeding with SNS.  Baby was circumcised today and has been sleepy since last feeding at 17 (>5 hours ago).   Baby is eager to breast feed and with several attempts latches with SNS formula supplementation.  Baby quickly took supplement and continued to breast feed for several more minutes.  Mom complains of breast hurting with latch, but baby seem to have deep latch with wide flanged lips after chin tug.  Encouraged mom to continue to latch baby with deep latch and to use EBM to nipples.  Mom to call for assist as needed.  Parents are more comfortable in using SNS.  Mom will continue to pump 6 times daily.    Patient Name: Sydney Little Date: 12/16/2013 Reason for consult: Follow-up assessment;Difficult latch   Maternal Data    Feeding Feeding Type: Breast Fed Length of feed: 10 min  LATCH Score/Interventions Latch: Repeated attempts needed to sustain latch, nipple held in mouth throughout feeding, stimulation needed to elicit sucking reflex. Intervention(s): Adjust position;Assist with latch;Breast compression  Audible Swallowing: Spontaneous and intermittent Intervention(s): Skin to skin;Hand expression  Type of Nipple: Everted at rest and after stimulation  Comfort (Breast/Nipple): Filling, red/small blisters or bruises, mild/mod discomfort  Problem noted: Mild/Moderate discomfort  Hold (Positioning): Assistance needed to correctly position infant at breast and maintain latch. Intervention(s): Skin to skin;Support Pillows;Breastfeeding basics reviewed  LATCH Score: 7  Lactation Tools Discussed/Used     Consult Status Consult Status: Follow-up Date: 12/17/13 Follow-up type: In-patient    Shoptaw, Justine Null 12/16/2013, 10:26 PM

## 2013-12-16 NOTE — Progress Notes (Signed)
PPD 1 SVD  S:  Reports feeling well              Tolerating po/ No nausea or vomiting             Bleeding is light             Pain controlled with motrin mostly             Up ad lib / ambulatory / voiding QS  Newborn breast feeding  / Circumcision desired- questions r/t possibility of outpt circ at day 5-7 of age  O:               VS: BP 124/78  Pulse 84  Temp(Src) 98 F (36.7 C) (Oral)  Resp 20  Ht 5\' 5"  (1.651 m)  Wt 73.029 kg (161 lb)  BMI 26.79 kg/m2  SpO2 98%  Breastfeeding? Unknown   LABS:              Recent Labs  12/14/13 2011 12/16/13 0731  WBC 16.4* 16.0*  HGB 11.5* 10.0*  PLT 178 152               Blood type: --/--/B POS, B POS (03/28 2010)  Rubella: Immune (09/02 0000)                                Physical Exam:             Alert and oriented X3  Abdomen: soft, non-tender, non-distended              Fundus: firm, non-tender, Ueven  Lochia: light  Extremities: trace edema, no calf pain or tenderness    A: PPD # 1   Doing well - stable status  P: Routine post partum orders  anticipate DC  Artelia Laroche CNM, MSN, [facility] 12/16/2013, 9:36 AM

## 2013-12-17 ENCOUNTER — Encounter (HOSPITAL_COMMUNITY): Payer: Self-pay | Admitting: Obstetrics and Gynecology

## 2013-12-17 ENCOUNTER — Ambulatory Visit: Payer: Self-pay

## 2013-12-17 DIAGNOSIS — D62 Acute posthemorrhagic anemia: Secondary | ICD-10-CM

## 2013-12-17 HISTORY — DX: Acute posthemorrhagic anemia: D62

## 2013-12-17 MED ORDER — FERRALET 90 90-1 MG PO TABS: 1.0000 | ORAL_TABLET | Freq: Every day | ORAL | Status: DC

## 2013-12-17 NOTE — Lactation Note (Signed)
This note was copied from the chart of [place]. Lactation Consultation Note  Patient Name: Sydney Little JOACZ'Y Date: 12/17/2013 Reason for consult: Follow-up assessment;Breast/nipple pain Parents report baby is using SNS at the breast well. Baby sleepy at this visit but Mom did attempt to BF using SNS with formula. It took few attempts for baby to obtain depth with latch, demonstrated how to bring bottom lip down, Mom reports more comfort at the breast. Baby nursed for about 5 minutes then fell asleep. LC did note baby has some difficulty sustaining depth with latch. Encouraged Mom to support breast while baby is BF.  Mom reports nipple tenderness, no cracking or bleeding observed, care for sore nipples discussed, advised to apply EBM, comfort gels given with instructions. On exam of Baby's mouth, this LC agrees with previous LC assessment that labial frenulum inserts just above alveolar ridge, frenulum under the tongue is anterior with some limited extension noted. LC asked Dr. Wilfred Lacy to evaluate frenulum while doing physical, he reported upward tongue mobility and did not recommend referral at this time. Another SNS given to parents to use till Mom's milk comes in. Encouraged to BF on the 1st breast without SNS, keep baby actively nursing for 15-20 minutes, then supplement using SNS on 2nd breast, follow guidelines given. Alternate each feeding the breast using the SNS. Discussed importance of baby learning to be efficient BF by not supplementing before baby has opportunity to BF. Post pump after feedings to encourage milk production and to have EBM to supplement if this is needed or baby continues to struggle with latch. Engorgement care reviewed. OP f/u scheduled for Monday, April 6th at 10:30. Advised of support group.   Maternal Data    Feeding Feeding Type: Bottle Fed - Formula Length of feed: 5 min  LATCH Score/Interventions Latch: Repeated attempts needed to sustain latch,  nipple held in mouth throughout feeding, stimulation needed to elicit sucking reflex. Intervention(s): Assist with latch  Audible Swallowing: A few with stimulation (w/SNS to supplement)  Type of Nipple: Everted at rest and after stimulation (short nipple shaft)  Comfort (Breast/Nipple): Filling, red/small blisters or bruises, mild/mod discomfort  Problem noted: Filling;Mild/Moderate discomfort Interventions (Mild/moderate discomfort): Hand massage;Hand expression;Comfort gels  Hold (Positioning): Assistance needed to correctly position infant at breast and maintain latch. Intervention(s): Breastfeeding basics reviewed;Support Pillows;Position options;Skin to skin  LATCH Score: 6  Lactation Tools Discussed/Used Tools: Pump;Comfort gels;14F feeding tube / Syringe;Supplemental Nutrition System Breast pump type: Double-Electric Breast Pump   Consult Status Consult Status: Complete Date: 12/17/13 Follow-up type: In-patient    Sydney Little 12/17/2013, 11:09 AM

## 2013-12-17 NOTE — Progress Notes (Signed)
Patient ID: Sydney Little, female   DOB: [birth year], 33 y.o.   MRN: [id number] PPD # 2  Subjective: Pt reports feeling well and eager for d/c home / Pain controlled with ibuprofen Tolerating po/ Voiding without problems/ No n/v Bleeding is light/ Newborn info:  Information for the patient's newborn:  Tahiri, Shareef [[id number]]  female  / circ performed last pm per Dr Benjie Karvonen / Feeding: breast    Objective:  VS: Blood pressure 106/65, pulse 75, temperature 98.3 F (36.8 C), temperature source Oral, resp. rate 18.    Recent Labs  12/14/13 2011 12/16/13 0731  WBC 16.4* 16.0*  HGB 11.5* 10.0*  HCT 34.9* 31.2*  PLT 178 152    Blood type:  B POS Rubella: Immune    Physical Exam:  General:  alert, cooperative and no distress CV: Regular rate and rhythm Resp: clear Abdomen: soft, nontender, normal bowel sounds Uterine Fundus: firm, below umbilicus, nontender Perineum: not inspected; pt is dressed Lochia: minimal Ext: Homans sign is negative, no sign of DVT and no edema, redness or tenderness in the calves or thighs    A/P: PPD # 2/ G3P1021/ S/P: SVD with minor periurethral tear ABL Anemia Doing well and stable for discharge home RX: OTC Ibuprofen Niferex 150mg  po QD/BID #30/#60 Refill x 1 WOB/GYN booklet given Routine pp visit in 6wks   Darleen Crocker, MSN, [facility] 12/17/2013, 8:55 AM

## 2013-12-17 NOTE — Discharge Summary (Signed)
Obstetric Discharge Summary Reason for Admission: G3 P0 0 2 0 @ 39wks with onset active labor; GBS Neg Prenatal Procedures: NST and ultrasound Intrapartum Procedures: spontaneous vaginal delivery Postpartum Procedures: none Complications-Operative and Postpartum: minor periurethral repair Hemoglobin  Date Value Ref Range Status  12/16/2013 10.0* 12.0 - 15.0 g/dL Final     HCT  Date Value Ref Range Status  12/16/2013 31.2* 36.0 - 46.0 % Final     Discharge Diagnoses: G3 P1 0 2 1 s/p SVD with minor periurethral repair  Discharge Information: Date: 12/17/2013 Activity: pelvic rest Diet: routine Medications: PNV, Ibuprofen, Colace and Iron Condition: stable Instructions: refer to practice specific booklet Discharge to: home Follow-up Information   Follow up with MODY,VAISHALI R, MD In 6 weeks.   Specialty:  Obstetrics and Gynecology   Contact information:   [place] [address] [phone]       Newborn Data: Live born female on 12/15/13 Birth Weight: 6 lb 8.6 oz (2965 g) APGAR: 7, 8  Home with mother.  Fabiha Rougeau K 12/17/2013, 9:51 AM

## 2013-12-19 ENCOUNTER — Ambulatory Visit (HOSPITAL_COMMUNITY)
Admission: RE | Admit: 2013-12-19 | Discharge: 2013-12-19 | Disposition: A | Discharge status: Home / Self Care | Payer: 59 | Source: Ambulatory Visit | Attending: Obstetrics & Gynecology | Admitting: Obstetrics & Gynecology

## 2013-12-19 NOTE — Lactation Note (Signed)
Adult Lactation Consultation Outpatient Visit Note  Sydney Little continues to have feeding difficulties.  Mom has sore nipples and is using hydrogel dressings to aid in healing.   Feedings are lasting up to an hour which may be related to muscle fatique.  Breast feeding is followed by 20-30 ml of expressed BM in a bottle.  Mom has been post pumping for 20 minutes after every feeding. The lingual frenum inserts close to the tip of the tongue.  The labial frenum inserts close to the alveolar ridge and Sydney Little does not flange his upper lip well. This flange is necessary to maintain a vacuum in the oral cavity.  Mother desires a referral to have the lingual frenum and possibly the labial frenum revised.   Patient Name: Sydney Little Date of Birth: 1980-12-07 Gestational Age at Delivery: Unknown Type of Delivery: Vaginal  Breastfeeding History: Frequency of Breastfeeding: every 2-3hours Length of Feeding: about an hour Voids: 6 Stools: 3+  Supplementing / Method: Pumping:  Type of Pump:PIS   Frequency:post feed  Volume:  20-65ml  Comments:    Consultation Evaluation:  Initial Feeding Assessment: Pre-feed [id number] Post-feed [id number]:7893 Amount Transferred:20 Comments:Mom's nipples are very sore related to Sydney Little's oral anatomy.  We attempted a NS and he transferred "0" ml.  He was reattached without the NS and he suckled better but his lips had to be flanged manually. The second attempt lasted about 20 minutes but mom had to use breast compression and coaxing to keep [place] suckling.  Additional Feeding Assessment: After feeding for almost 30 minutes on the left breast Sydney Little was too sleepy to latch to the right breast.   Total Breast milk Transferred this Visit: 20 Total Supplement Given: Mom will supplement with expressed BM at home.  Additional Interventions:   Follow-Up  Plan is for mother to continue the plan she has been using.  She may rest the right breast related to nipple trauma.    If a frenum revision occurs she will schedule a lactation appointment post revision.  If not she will continue to monitor Sydney Little's weight and her milk supply.     Van Clines 12/19/2013, 9:18 AM

## 2013-12-23 ENCOUNTER — Ambulatory Visit (HOSPITAL_COMMUNITY): Admit: 2013-12-23 | Payer: 59

## 2013-12-24 ENCOUNTER — Encounter (HOSPITAL_COMMUNITY): Payer: 59

## 2013-12-24 ENCOUNTER — Ambulatory Visit (HOSPITAL_COMMUNITY)
Admission: RE | Admit: 2013-12-24 | Discharge: 2013-12-24 | Disposition: A | Discharge status: Home / Self Care | Payer: 59 | Source: Ambulatory Visit | Attending: Obstetrics and Gynecology | Admitting: Obstetrics and Gynecology

## 2013-12-24 NOTE — Lactation Note (Signed)
Adult Lactation Consultation Outpatient Visit Note  Sydney Little is here post frenotomy from Dr. Constance Holster.  The visible anterior lingual frenum was clipped.  The labial frenum was not addressed.  Sydney Little was able to pull a gloved finger deeper into his mouth but he was chewing intermittently and snap back was still present thus breaking the seal.  He was placed on the left breast and transferred 15 ml.  He did not have a rhythmic suckle and was tiring.  Suckling/ swallowing burst ratio ranged from 4-7 suckles per swallow. He was then transferred to the right breast and transferred 33 ml.  More swallows were audible but a rhythmic pattern was not present.  Recommended parents consider contacting Dr. Aaron Ed[facility] for further evaluation.      Patient Name: Sydney Little Date of Birth: 01-27-1981 Gestational Age at Delivery: Unknown Type of Delivery:   Breastfeeding History: Frequency of Breastfeeding: Has not BF in 2 days related to painful latch Length of Feeding:  Voids:  Stools:   Supplementing / Method: Pumping:  Type of Pump:PIS   Frequency:every 2-3 hours for 30 minutes.  Recommended decreasing to 20 minutes.   Volume:  75-100 ml  Comments:    Consultation Evaluation:  Initial Feeding Assessment: Pre-feed Weight:3070 6+12.3 Post-feed Weight:3118 Amount Transferred:48 Comments:from both breasts  Total Breast milk Transferred this Visit: 48 Total Supplement Given:   Additional Interventions:   Follow-Up  As parents desire.     Van Clines 12/24/2013, 2:31 PM

## 2014-07-21 ENCOUNTER — Encounter (HOSPITAL_COMMUNITY): Payer: Self-pay | Admitting: Obstetrics and Gynecology

## 2014-09-19 NOTE — L&D Delivery Note (Signed)
Delivery Note At 2:41 PM a viable and healthy female was delivered via Vaginal, Spontaneous Delivery (Presentation: Left Occiput Anterior).  APGAR: 8, 9; weight- pending  Placenta status: Intact, Spontaneous.  Cord: 3 vessels with the following complications: Nuchal cord x 2, reduced at head delivery.   Cord pH: N/A  Anesthesia: Epidural  Episiotomy: None Lacerations:  None Est. Blood Loss (mL):  200 cc  Mom to postpartum.  Baby to Couplet care / Skin to Skin.  MODY,Sydney Little 06/11/2015, 3:02 PM

## 2014-11-03 LAB — OB RESULTS CONSOLE HIV ANTIBODY (ROUTINE TESTING): HIV: NONREACTIVE

## 2014-11-03 LAB — OB RESULTS CONSOLE ABO/RH: RH Type: POSITIVE

## 2014-11-03 LAB — OB RESULTS CONSOLE RPR: RPR: NONREACTIVE

## 2014-11-03 LAB — OB RESULTS CONSOLE HEPATITIS B SURFACE ANTIGEN: Hepatitis B Surface Ag: NEGATIVE

## 2014-11-03 LAB — OB RESULTS CONSOLE ANTIBODY SCREEN: Antibody Screen: NEGATIVE

## 2014-11-03 LAB — OB RESULTS CONSOLE RUBELLA ANTIBODY, IGM: Rubella: IMMUNE

## 2014-11-18 LAB — OB RESULTS CONSOLE GC/CHLAMYDIA
Chlamydia: NEGATIVE
Gonorrhea: NEGATIVE

## 2015-03-13 ENCOUNTER — Emergency Department (HOSPITAL_BASED_OUTPATIENT_CLINIC_OR_DEPARTMENT_OTHER)
Admission: EM | Admit: 2015-03-13 | Discharge: 2015-03-13 | Disposition: A | Discharge status: Home / Self Care | Payer: 59 | Attending: Emergency Medicine | Admitting: Emergency Medicine

## 2015-03-13 ENCOUNTER — Encounter (HOSPITAL_BASED_OUTPATIENT_CLINIC_OR_DEPARTMENT_OTHER): Payer: Self-pay | Admitting: Emergency Medicine

## 2015-03-13 DIAGNOSIS — Z79899 Other long term (current) drug therapy: Secondary | ICD-10-CM | POA: Insufficient documentation

## 2015-03-13 DIAGNOSIS — Z3A27 27 weeks gestation of pregnancy: Secondary | ICD-10-CM | POA: Diagnosis not present

## 2015-03-13 DIAGNOSIS — Y9289 Other specified places as the place of occurrence of the external cause: Secondary | ICD-10-CM | POA: Insufficient documentation

## 2015-03-13 DIAGNOSIS — O99013 Anemia complicating pregnancy, third trimester: Secondary | ICD-10-CM | POA: Diagnosis not present

## 2015-03-13 DIAGNOSIS — O9A213 Injury, poisoning and certain other consequences of external causes complicating pregnancy, third trimester: Secondary | ICD-10-CM | POA: Diagnosis present

## 2015-03-13 DIAGNOSIS — S0006XA Insect bite (nonvenomous) of scalp, initial encounter: Secondary | ICD-10-CM | POA: Insufficient documentation

## 2015-03-13 DIAGNOSIS — Y998 Other external cause status: Secondary | ICD-10-CM | POA: Insufficient documentation

## 2015-03-13 DIAGNOSIS — W57XXXA Bitten or stung by nonvenomous insect and other nonvenomous arthropods, initial encounter: Secondary | ICD-10-CM | POA: Diagnosis not present

## 2015-03-13 DIAGNOSIS — Y9389 Activity, other specified: Secondary | ICD-10-CM | POA: Insufficient documentation

## 2015-03-13 NOTE — ED Notes (Signed)
Active fetal movement with palpation and mother denies any complications r/t pregnancy, frequent fetal movement reported, no vaginal bleeding.  Per MD no need for fetal HR assessment at this time.

## 2015-03-13 NOTE — ED Provider Notes (Signed)
CSN: [id number]     Arrival date & time 03/13/15  1549 History   First MD Initiated Contact with Patient 03/13/15 1607     Chief Complaint  Patient presents with  . Tick bite      The history is provided by the patient. No language interpreter was used.   Sydney Little presents for evaluation of tick bite. She had a tick on her posterior scalp that she noticed 3 days ago. She states the tick wasn't engorged and she removed it. She is unsure how long the tick was in place. She feels as if she may have a tender swollen lymph node on her left occipital region. She denies any fevers, malaise, neck stiffness, headache, photophobia, rash. She comes in today because she is concerned for potential Lyme disease. She is [redacted] weeks pregnant. She has had no problems with this pregnancy and feels frequent fetal activity.  Past Medical History  Diagnosis Date  . Postpartum care following vaginal delivery (3/29) 12/15/2013  . SVD (spontaneous vaginal delivery) 12/17/2013  . Acute blood loss anemia 12/17/2013   Past Surgical History  Procedure Laterality Date  . Dilation and curettage of uterus    . Wisdom tooth extraction     No family history on file. History  Substance Use Topics  . Smoking status: Never Smoker   . Smokeless tobacco: Not on file  . Alcohol Use: No   OB History    Gravida Para Term Preterm AB TAB SAB Ectopic Multiple Living   4 1 1  2 2    1      Review of Systems  All other systems reviewed and are negative.     Allergies  Review of patient's allergies indicates no known allergies.  Home Medications   Prior to Admission medications   Medication Sig Start Date End Date Taking? Authorizing Provider  Docusate Sodium (COLACE PO) Take 2 tablets by mouth at bedtime as needed.    Historical Provider, MD  Fe Cbn-Fe Gluc-FA-B12-C-DSS (FERRALET 90) 90-1 MG TABS Take 1 tablet by mouth daily. 12/17/13   Gustavo Lah, NP  ferrous fumarate (FERRO-SEQUELS) 50 MG CR tablet Take 50 mg by  mouth 3 (three) times daily with meals.    Historical Provider, MD  loratadine (CLARITIN) 10 MG tablet Take 10 mg by mouth daily as needed for allergies.    Historical Provider, MD  Prenatal Vit-Fe Fumarate-FA (MULTIVITAMIN-PRENATAL) 27-0.8 MG TABS tablet Take 1 tablet by mouth daily at 12 noon.    Historical Provider, MD   BP 124/71 mmHg  Pulse 95  Temp(Src) 98.3 F (36.8 C) (Oral)  Resp 16  Ht 5\' 4"  (1.626 m)  Wt 139 lb (63.05 kg)  BMI 23.85 kg/m2  SpO2 100% Physical Exam  Constitutional: She is oriented to person, place, and time. She appears well-developed and well-nourished.  HENT:  Head: Normocephalic and atraumatic.  Mouth/Throat: Oropharynx is clear and moist.  L TM obscured by cerumen.  R TM clear.  Left posterior scalp with pinpoint healing lesion at site of tick bite.  No surrounding erythema, induration.    Eyes: EOM are normal. Pupils are equal, round, and reactive to light.  Neck: Neck supple.  No pathologic lymphadenopathy  Cardiovascular: Normal rate and regular rhythm.   No murmur heard. Pulmonary/Chest: Effort normal and breath sounds normal. No respiratory distress.  Abdominal:  Gravid uterus  Neurological: She is alert and oriented to person, place, and time.  Skin: Skin is warm and dry.  Psychiatric:  She has a normal mood and affect. Her behavior is normal.  Nursing note and vitals reviewed.   ED Course  Procedures (including critical care time) Labs Review Labs Reviewed - No data to display  Imaging Review No results found.   EKG Interpretation None      MDM   Final diagnoses:  Tick bite of occipital region of scalp, initial encounter   Patient here for evaluation of tick bite. There is no evidence of infectious process at this time. Patient is currently pregnant, do not feel prophylactic antibiotics are warranted given risk of antibiotic therapy in pregnancy. Patient is likely too close to the time of tick bite for Lyme and RMSF titers to be  of use, especially given patient's lack of clinical symptoms at this time. Offered patient reassurance with close outpatient follow-up as well as return precautions.  Quintella Reichert, MD 03/13/15 [phone]

## 2015-03-13 NOTE — Discharge Instructions (Signed)
Tick Bite Information Ticks are insects that attach themselves to the skin and draw blood for food. There are various types of ticks. Common types include wood ticks and deer ticks. Most ticks live in shrubs and grassy areas. Ticks can climb onto your body when you make contact with leaves or grass where the tick is waiting. The most common places on the body for ticks to attach themselves are the scalp, neck, armpits, waist, and groin. Most tick bites are harmless, but sometimes ticks carry germs that cause diseases. These germs can be spread to a person during the tick's feeding process. The chance of a disease spreading through a tick bite depends on:   The type of tick.  Time of year.   How long the tick is attached.   Geographic location.  HOW CAN YOU PREVENT TICK BITES? Take these steps to help prevent tick bites when you are outdoors:  Wear protective clothing. Long sleeves and long pants are best.   Wear white clothes so you can see ticks more easily.  Tuck your pant legs into your socks.   If walking on a trail, stay in the middle of the trail to avoid brushing against bushes.  Avoid walking through areas with long grass.  Put insect repellent on all exposed skin and along boot tops, pant legs, and sleeve cuffs.   Check clothing, hair, and skin repeatedly and before going inside.   Brush off any ticks that are not attached.  Take a shower or bath as soon as possible after being outdoors.  WHAT IS THE PROPER WAY TO REMOVE A TICK? Ticks should be removed as soon as possible to help prevent diseases caused by tick bites. 1. If latex gloves are available, put them on before trying to remove a tick.  2. Using fine-point tweezers, grasp the tick as close to the skin as possible. You may also use curved forceps or a tick removal tool. Grasp the tick as close to its head as possible. Avoid grasping the tick on its body. 3. Pull gently with steady upward pressure until  the tick lets go. Do not twist the tick or jerk it suddenly. This may break off the tick's head or mouth parts. 4. Do not squeeze or crush the tick's body. This could force disease-carrying fluids from the tick into your body.  5. After the tick is removed, wash the bite area and your hands with soap and water or other disinfectant such as alcohol. 6. Apply a small amount of antiseptic cream or ointment to the bite site.  7. Wash and disinfect any instruments that were used.  Do not try to remove a tick by applying a hot match, petroleum jelly, or fingernail polish to the tick. These methods do not work and may increase the chances of disease being spread from the tick bite.  WHEN SHOULD YOU SEEK MEDICAL CARE? Contact your health care provider if you are unable to remove a tick from your skin or if a part of the tick breaks off and is stuck in the skin.  After a tick bite, you need to be aware of signs and symptoms that could be related to diseases spread by ticks. Contact your health care provider if you develop any of the following in the days or weeks after the tick bite:  Unexplained fever.  Rash. A circular rash that appears days or weeks after the tick bite may indicate the possibility of Lyme disease. The rash may resemble   a target with a bull's-eye and may occur at a different part of your body than the tick bite.  Redness and swelling in the area of the tick bite.   Tender, swollen lymph glands.   Diarrhea.   Weight loss.   Cough.   Fatigue.   Muscle, joint, or bone pain.   Abdominal pain.   Headache.   Lethargy or a change in your level of consciousness.  Difficulty walking or moving your legs.   Numbness in the legs.   Paralysis.  Shortness of breath.   Confusion.   Repeated vomiting.  Document Released: 09/02/2000 Document Revised: 06/26/2013 Document Reviewed: 02/13/2013 [employer] Patient Information 2015 [employer]. This information is  not intended to replace advice given to you by your health care provider. Make sure you discuss any questions you have with your health care provider.  

## 2015-03-13 NOTE — ED Notes (Signed)
Pt got a tick off of her scalp 3 days ago.  Sts her lymph nodes are swollen and she is coughing.  Denies fever.  Is [redacted] wks pregnant and wants to be checked for possible Lymes.

## 2015-03-13 NOTE — ED Notes (Signed)
MD at bedside. 

## 2015-05-15 LAB — OB RESULTS CONSOLE GBS: GBS: NEGATIVE

## 2015-06-09 ENCOUNTER — Encounter (HOSPITAL_COMMUNITY): Payer: Self-pay | Admitting: *Deleted

## 2015-06-09 ENCOUNTER — Telehealth (HOSPITAL_COMMUNITY): Payer: Self-pay | Admitting: *Deleted

## 2015-06-09 NOTE — Telephone Encounter (Signed)
Preadmission screen  

## 2015-06-10 ENCOUNTER — Other Ambulatory Visit: Payer: Self-pay | Admitting: Obstetrics & Gynecology

## 2015-06-11 ENCOUNTER — Encounter (HOSPITAL_COMMUNITY): Payer: Self-pay

## 2015-06-11 ENCOUNTER — Inpatient Hospital Stay (HOSPITAL_COMMUNITY): Payer: 59 | Admitting: Anesthesiology

## 2015-06-11 ENCOUNTER — Inpatient Hospital Stay (HOSPITAL_COMMUNITY)
Admission: RE | Admit: 2015-06-11 | Discharge: 2015-06-12 | DRG: 775 | Disposition: A | Discharge status: Home / Self Care | Payer: 59 | Source: Ambulatory Visit | Attending: Obstetrics & Gynecology | Admitting: Obstetrics & Gynecology

## 2015-06-11 DIAGNOSIS — D259 Leiomyoma of uterus, unspecified: Secondary | ICD-10-CM | POA: Diagnosis present

## 2015-06-11 DIAGNOSIS — O9912 Other diseases of the blood and blood-forming organs and certain disorders involving the immune mechanism complicating childbirth: Secondary | ICD-10-CM | POA: Diagnosis present

## 2015-06-11 DIAGNOSIS — D62 Acute posthemorrhagic anemia: Secondary | ICD-10-CM | POA: Diagnosis not present

## 2015-06-11 DIAGNOSIS — O9081 Anemia of the puerperium: Secondary | ICD-10-CM | POA: Diagnosis not present

## 2015-06-11 DIAGNOSIS — O3413 Maternal care for benign tumor of corpus uteri, third trimester: Secondary | ICD-10-CM | POA: Diagnosis present

## 2015-06-11 DIAGNOSIS — D696 Thrombocytopenia, unspecified: Secondary | ICD-10-CM | POA: Diagnosis present

## 2015-06-11 DIAGNOSIS — Z8249 Family history of ischemic heart disease and other diseases of the circulatory system: Secondary | ICD-10-CM | POA: Diagnosis not present

## 2015-06-11 DIAGNOSIS — O3410 Maternal care for benign tumor of corpus uteri, unspecified trimester: Secondary | ICD-10-CM | POA: Diagnosis present

## 2015-06-11 DIAGNOSIS — Z3A39 39 weeks gestation of pregnancy: Secondary | ICD-10-CM | POA: Diagnosis present

## 2015-06-11 DIAGNOSIS — O26893 Other specified pregnancy related conditions, third trimester: Secondary | ICD-10-CM | POA: Diagnosis present

## 2015-06-11 DIAGNOSIS — Z3490 Encounter for supervision of normal pregnancy, unspecified, unspecified trimester: Secondary | ICD-10-CM

## 2015-06-11 LAB — RPR: RPR Ser Ql: NONREACTIVE

## 2015-06-11 LAB — TYPE AND SCREEN
ABO/RH(D): B POS
Antibody Screen: NEGATIVE

## 2015-06-11 LAB — CBC
HCT: 35.6 % — ABNORMAL LOW (ref 36.0–46.0)
Hemoglobin: 11.5 g/dL — ABNORMAL LOW (ref 12.0–15.0)
MCH: 26.7 pg (ref 26.0–34.0)
MCHC: 32.3 g/dL (ref 30.0–36.0)
MCV: 82.6 fL (ref 78.0–100.0)
Platelets: 137 10*3/uL — ABNORMAL LOW (ref 150–400)
RBC: 4.31 MIL/uL (ref 3.87–5.11)
RDW: 14.9 % (ref 11.5–15.5)
WBC: 8.6 10*3/uL (ref 4.0–10.5)

## 2015-06-11 MED ORDER — ACETAMINOPHEN 325 MG PO TABS
650.0000 mg | ORAL_TABLET | ORAL | Status: DC | PRN
  Administered 2015-06-12: 650 mg via ORAL
  Filled 2015-06-11: qty 2

## 2015-06-11 MED ORDER — DIPHENHYDRAMINE HCL 25 MG PO CAPS: 25.0000 mg | ORAL_CAPSULE | Freq: Four times a day (QID) | ORAL | Status: DC | PRN

## 2015-06-11 MED ORDER — OXYTOCIN BOLUS FROM INFUSION: 500.0000 mL | INTRAVENOUS | Status: DC

## 2015-06-11 MED ORDER — IBUPROFEN 600 MG PO TABS
600.0000 mg | ORAL_TABLET | Freq: Four times a day (QID) | ORAL | Status: DC
  Administered 2015-06-11 – 2015-06-12 (×5): 600 mg via ORAL
  Filled 2015-06-11 (×5): qty 1

## 2015-06-11 MED ORDER — ONDANSETRON HCL 4 MG/2ML IJ SOLN: 4.0000 mg | Freq: Four times a day (QID) | INTRAMUSCULAR | Status: DC | PRN

## 2015-06-11 MED ORDER — LIDOCAINE HCL (PF) 1 % IJ SOLN
INTRAMUSCULAR | Status: DC | PRN
  Administered 2015-06-11 (×2): 4 mL via EPIDURAL

## 2015-06-11 MED ORDER — OXYTOCIN 40 UNITS IN LACTATED RINGERS INFUSION - SIMPLE MED
1.0000 m[IU]/min | INTRAVENOUS | Status: DC
  Administered 2015-06-11: 2 m[IU]/min via INTRAVENOUS
  Filled 2015-06-11: qty 1000

## 2015-06-11 MED ORDER — LANOLIN HYDROUS EX OINT: TOPICAL_OINTMENT | CUTANEOUS | Status: DC | PRN

## 2015-06-11 MED ORDER — OXYCODONE-ACETAMINOPHEN 5-325 MG PO TABS: 1.0000 | ORAL_TABLET | ORAL | Status: DC | PRN

## 2015-06-11 MED ORDER — BENZOCAINE-MENTHOL 20-0.5 % EX AERO
1.0000 "application " | INHALATION_SPRAY | CUTANEOUS | Status: DC | PRN
  Administered 2015-06-11: 1 via TOPICAL
  Filled 2015-06-11: qty 56

## 2015-06-11 MED ORDER — OXYCODONE-ACETAMINOPHEN 5-325 MG PO TABS: 2.0000 | ORAL_TABLET | ORAL | Status: DC | PRN

## 2015-06-11 MED ORDER — SENNOSIDES-DOCUSATE SODIUM 8.6-50 MG PO TABS
2.0000 | ORAL_TABLET | ORAL | Status: DC
  Administered 2015-06-11: 2 via ORAL
  Filled 2015-06-11: qty 2

## 2015-06-11 MED ORDER — ZOLPIDEM TARTRATE 5 MG PO TABS: 5.0000 mg | ORAL_TABLET | Freq: Every evening | ORAL | Status: DC | PRN

## 2015-06-11 MED ORDER — TETANUS-DIPHTH-ACELL PERTUSSIS 5-2.5-18.5 LF-MCG/0.5 IM SUSP: 0.5000 mL | Freq: Once | INTRAMUSCULAR | Status: DC

## 2015-06-11 MED ORDER — OXYCODONE-ACETAMINOPHEN 5-325 MG PO TABS
1.0000 | ORAL_TABLET | ORAL | Status: DC | PRN
  Administered 2015-06-12: 1 via ORAL
  Filled 2015-06-11: qty 1

## 2015-06-11 MED ORDER — ACETAMINOPHEN 325 MG PO TABS: 650.0000 mg | ORAL_TABLET | ORAL | Status: DC | PRN

## 2015-06-11 MED ORDER — DIPHENHYDRAMINE HCL 50 MG/ML IJ SOLN: 12.5000 mg | INTRAMUSCULAR | Status: DC | PRN

## 2015-06-11 MED ORDER — CITRIC ACID-SODIUM CITRATE 334-500 MG/5ML PO SOLN: 30.0000 mL | ORAL | Status: DC | PRN

## 2015-06-11 MED ORDER — PHENYLEPHRINE 40 MCG/ML (10ML) SYRINGE FOR IV PUSH (FOR BLOOD PRESSURE SUPPORT)
80.0000 ug | PREFILLED_SYRINGE | INTRAVENOUS | Status: DC | PRN
  Filled 2015-06-11: qty 20

## 2015-06-11 MED ORDER — LACTATED RINGERS IV SOLN
500.0000 mL | INTRAVENOUS | Status: DC | PRN
  Administered 2015-06-11: 500 mL via INTRAVENOUS

## 2015-06-11 MED ORDER — FLEET ENEMA 7-19 GM/118ML RE ENEM: 1.0000 | ENEMA | RECTAL | Status: DC | PRN

## 2015-06-11 MED ORDER — WITCH HAZEL-GLYCERIN EX PADS: 1.0000 "application " | MEDICATED_PAD | CUTANEOUS | Status: DC | PRN

## 2015-06-11 MED ORDER — FENTANYL 2.5 MCG/ML BUPIVACAINE 1/10 % EPIDURAL INFUSION (WH - ANES)
14.0000 mL/h | INTRAMUSCULAR | Status: DC | PRN
  Administered 2015-06-11 (×2): 14 mL/h via EPIDURAL
  Filled 2015-06-11: qty 125

## 2015-06-11 MED ORDER — SIMETHICONE 80 MG PO CHEW: 80.0000 mg | CHEWABLE_TABLET | ORAL | Status: DC | PRN

## 2015-06-11 MED ORDER — LACTATED RINGERS IV SOLN: INTRAVENOUS | Status: DC

## 2015-06-11 MED ORDER — TERBUTALINE SULFATE 1 MG/ML IJ SOLN: 0.2500 mg | Freq: Once | INTRAMUSCULAR | Status: DC | PRN

## 2015-06-11 MED ORDER — EPHEDRINE 5 MG/ML INJ: 10.0000 mg | INTRAVENOUS | Status: DC | PRN

## 2015-06-11 MED ORDER — PRENATAL MULTIVITAMIN CH
1.0000 | ORAL_TABLET | Freq: Every day | ORAL | Status: DC
  Administered 2015-06-12: 1 via ORAL
  Filled 2015-06-11: qty 1

## 2015-06-11 MED ORDER — OXYTOCIN 40 UNITS IN LACTATED RINGERS INFUSION - SIMPLE MED
62.5000 mL/h | INTRAVENOUS | Status: DC
  Administered 2015-06-11: 62.5 mL/h via INTRAVENOUS

## 2015-06-11 MED ORDER — DIBUCAINE 1 % RE OINT: 1.0000 "application " | TOPICAL_OINTMENT | RECTAL | Status: DC | PRN

## 2015-06-11 MED ORDER — LIDOCAINE HCL (PF) 1 % IJ SOLN
30.0000 mL | INTRAMUSCULAR | Status: DC | PRN
  Filled 2015-06-11: qty 30

## 2015-06-11 NOTE — H&P (Signed)
Kae Lauman is a 34 y.o. female presenting for labor IOL for favorable cervix. No complaints.  [facility] - Dr MOdy/[facility]. One large fibroid. Right posterior, 10 cm, not much pain in this pregnancy. Low platelets and Anemia. Last sono 9/12 at 7'11" at 74% (prior baby 1 lb smaller, so concerned about getting too large by 41 wks if waited)   History OB History    Gravida Para Term Preterm AB TAB SAB Ectopic Multiple Living   4 1 1  2 2    1      Past Medical History  Diagnosis Date  . Postpartum care following vaginal delivery (3/29) 12/15/2013  . SVD (spontaneous vaginal delivery) 12/17/2013  . Acute blood loss anemia 12/17/2013  . Fibroid     large 10cm  . Vaginal Pap smear, abnormal    Past Surgical History  Procedure Laterality Date  . Dilation and curettage of uterus    . Wisdom tooth extraction     Family History: family history includes Cancer in her maternal grandfather, mother, paternal grandfather, and paternal grandmother; Hypertension in her father. Social History:  reports that she has never smoked. She has never used smokeless tobacco. She reports that she does not drink alcohol or use illicit drugs.   Prenatal Transfer Tool  Maternal Diabetes: No Genetic Screening: Normal Maternal Ultrasounds/Referrals: Normal Fetal Ultrasounds or other Referrals:  None Maternal Substance Abuse:  No Significant Maternal Medications:  None Significant Maternal Lab Results:  GBS(-) Other Comments:  None  ROS   Neg   Dilation: 3 Effacement (%): 70 Station: -3 Exam by:: J.Thornton, RN Blood pressure 94/55, pulse 74, temperature 98.1 F (36.7 C), temperature source Oral, resp. rate 16, height 5\' 4"  (1.626 m), weight 151 lb (68.493 kg), SpO2 94 %, unknown if currently breastfeeding. Exam Physical Exam  A&O x 3, no acute distress. Pleasant HEENT neg, no thyromegaly Lungs CTA bilat CV RRR, S1S2 normal Abdo soft, non tender, non acute Extr no edema/ tenderness Pelvic above FHT   130/ + accels/ no decels/ mod variab- category I Toco regular, low dose pitocin  Prenatal labs: ABO, Rh: --/--/B POS (09/22 0745) Antibody: NEG (09/22 0745) Rubella: Immune (02/15 0000) RPR: Nonreactive (02/15 0000)  HBsAg: Negative (02/15 0000)  HIV: Non-reactive (02/15 0000)  GBS: Negative (08/26 0000)   Assessment/Plan: 34 yo, G4P1021 at 39.6 wks, IOL for favorable cervix, EFW 7.1/2 lbs. Anticipate SVD. GBS(-). Pitocin, epidural, AROM when ready. FHT-category I.   MODY,VAISHALI R 06/11/2015, 12:43 PM

## 2015-06-11 NOTE — Lactation Note (Signed)
This note was copied from the chart of [place]. Lactation Consultation Note  P2.  BF older child for 10 months.  First 6 weeks she pumped due to difficult latch. Mother denies problems or concerns with this baby.  States baby is breastfeeding well. States she knows how to hand express.  Mother has [employer].  Would like to get DEBP tomorrow. Mom made aware of O/P services, breastfeeding support groups, community resources, and our phone # for post-discharge questions.  Mom encouraged to feed baby 8-12 times/24 hours and with feeding cues.    Patient Name: [name] Date: 06/11/2015 Reason for consult: Initial assessment   Maternal Data Has patient been taught Hand Expression?: Yes Does the patient have breastfeeding experience prior to this delivery?: Yes  Feeding Feeding Type: Breast Fed Length of feed: 10 min  LATCH Score/Interventions                      Lactation Tools Discussed/Used     Consult Status Consult Status: Follow-up Date: 06/12/15 Follow-up type: In-patient    Vivianne Master [facility] 06/11/2015, 10:41 PM

## 2015-06-11 NOTE — Anesthesia Procedure Notes (Signed)
Epidural Patient location during procedure: OB Start time: 06/11/2015 12:12 PM  Staffing Anesthesiologist: Josephine Igo Performed by: anesthesiologist   Preanesthetic Checklist Completed: patient identified, site marked, surgical consent, pre-op evaluation, timeout performed, IV checked, risks and benefits discussed and monitors and equipment checked  Epidural Patient position: sitting Prep: site prepped and draped and DuraPrep Patient monitoring: continuous pulse ox and blood pressure Approach: midline Location: L3-L4 Injection technique: LOR air  Needle:  Needle type: Tuohy  Needle gauge: 17 G Needle length: 9 cm and 9 Needle insertion depth: 4 cm Catheter type: closed end flexible Catheter size: 19 Gauge Catheter at skin depth: 9 cm Test dose: negative and Other  Assessment Events: blood not aspirated, injection not painful, no injection resistance, negative IV test and no paresthesia  Additional Notes Patient identified. Risks and benefits discussed including failed block, incomplete  Pain control, post dural puncture headache, nerve damage, paralysis, blood pressure Changes, nausea, vomiting, reactions to medications-both toxic and allergic and post Partum back pain. All questions were answered. Patient expressed understanding and wished to proceed. Sterile technique was used throughout procedure. Epidural site was Dressed with sterile barrier dressing. No paresthesias, signs of intravascular injection Or signs of intrathecal spread were encountered.  Patient was more comfortable after the epidural was dosed. Please see RN's note for documentation of vital signs and FHR which are stable.

## 2015-06-11 NOTE — Anesthesia Preprocedure Evaluation (Signed)
Anesthesia Evaluation  Patient identified by MRN, date of birth, ID band Patient awake    Reviewed: Allergy & Precautions, Patient's Chart, lab work & pertinent test results  Airway Mallampati: II  TM Distance: >3 FB Neck ROM: Full    Dental no notable dental hx. (+) Teeth Intact   Pulmonary neg pulmonary ROS,    Pulmonary exam normal breath sounds clear to auscultation       Cardiovascular negative cardio ROS Normal cardiovascular exam Rhythm:Regular Rate:Normal     Neuro/Psych negative neurological ROS  negative psych ROS   GI/Hepatic negative GI ROS, Neg liver ROS,   Endo/Other  negative endocrine ROS  Renal/GU negative Renal ROS  negative genitourinary   Musculoskeletal negative musculoskeletal ROS (+)   Abdominal (+) - obese,   Peds  Hematology  (+) anemia , Thrombocytopenia   Anesthesia Other Findings   Reproductive/Obstetrics (+) Pregnancy Fibroids                             Anesthesia Physical Anesthesia Plan  ASA: II  Anesthesia Plan: Epidural   Post-op Pain Management:    Induction:   Airway Management Planned: Natural Airway  Additional Equipment:   Intra-op Plan:   Post-operative Plan:   Informed Consent: I have reviewed the patients History and Physical, chart, labs and discussed the procedure including the risks, benefits and alternatives for the proposed anesthesia with the patient or authorized representative who has indicated his/her understanding and acceptance.     Plan Discussed with: Anesthesiologist  Anesthesia Plan Comments:         Anesthesia Quick Evaluation

## 2015-06-12 ENCOUNTER — Encounter (HOSPITAL_COMMUNITY): Payer: Self-pay

## 2015-06-12 DIAGNOSIS — D62 Acute posthemorrhagic anemia: Secondary | ICD-10-CM | POA: Diagnosis not present

## 2015-06-12 LAB — CBC
HCT: 30.6 % — ABNORMAL LOW (ref 36.0–46.0)
Hemoglobin: 9.8 g/dL — ABNORMAL LOW (ref 12.0–15.0)
MCH: 26.6 pg (ref 26.0–34.0)
MCHC: 32 g/dL (ref 30.0–36.0)
MCV: 82.9 fL (ref 78.0–100.0)
Platelets: 119 10*3/uL — ABNORMAL LOW (ref 150–400)
RBC: 3.69 MIL/uL — ABNORMAL LOW (ref 3.87–5.11)
RDW: 14.8 % (ref 11.5–15.5)
WBC: 11.2 10*3/uL — ABNORMAL HIGH (ref 4.0–10.5)

## 2015-06-12 MED ORDER — MAGNESIUM OXIDE 400 (241.3 MG) MG PO TABS
200.0000 mg | ORAL_TABLET | Freq: Every day | ORAL | Status: DC
  Administered 2015-06-12: 200 mg via ORAL
  Filled 2015-06-12 (×2): qty 0.5

## 2015-06-12 MED ORDER — POLYSACCHARIDE IRON COMPLEX 150 MG PO CAPS: 150.0000 mg | ORAL_CAPSULE | Freq: Every day | ORAL | Status: DC

## 2015-06-12 MED ORDER — POLYSACCHARIDE IRON COMPLEX 150 MG PO CAPS
150.0000 mg | ORAL_CAPSULE | Freq: Every day | ORAL | Status: DC
  Administered 2015-06-12: 150 mg via ORAL
  Filled 2015-06-12: qty 1

## 2015-06-12 MED ORDER — MAGNESIUM OXIDE 400 (241.3 MG) MG PO TABS: 200.0000 mg | ORAL_TABLET | Freq: Every day | ORAL | Status: DC

## 2015-06-12 MED ORDER — IBUPROFEN 600 MG PO TABS: 600.0000 mg | ORAL_TABLET | Freq: Four times a day (QID) | ORAL | Status: DC

## 2015-06-12 NOTE — Discharge Instructions (Signed)
Breast Pumping Tips °If you are breastfeeding, there may be times when you cannot feed your baby directly. Returning to work or going on a trip are common examples. Pumping allows you to store breast milk and feed it to your baby later.  °You may not get much milk when you first start to pump. Your breasts should start to make more after a few days. If you pump at the times you usually feed your baby, you may be able to keep making enough milk to feed your baby without also using formula. The more often you pump, the more milk you will produce.  °WHEN SHOULD I PUMP?  °· You can begin to pump soon after delivery. However, some experts recommend waiting about 4 weeks before giving your infant a bottle to make sure breastfeeding is going well.  °· If you plan to return to work, begin pumping a few weeks before. This will help you develop techniques that work best for you. It also lets you build up a supply of breast milk.   °· When you are with your infant, feed on demand and pump after each feeding.   °· When you are away from your infant for several hours, pump for about 15 minutes every 2-3 hours. Pump both breasts at the same time if you can.   °· If your infant has a formula feeding, make sure to pump around the same time.     °· If you drink any alcohol, wait 2 hours before pumping.   °HOW DO I PREPARE TO PUMP? °Your let-down reflex is the natural reaction to stimulation that makes your breast milk flow. It is easier to stimulate this reflex when you are relaxed. Find relaxation techniques that work for you. If you have difficulty with your let-down reflex, try these methods:  °· Smell one of your infant's blankets or an item of clothing.   °· Look at a picture or video of your infant.   °· Sit in a quiet, private space.   °· Massage the breast you plan to pump.   °· Place soothing warmth on the breast.   °· Play relaxing music.   °WHAT ARE SOME GENERAL BREAST PUMPING TIPS? °· Wash your hands before you pump. You  do not need to wash your nipples or breasts. °· There are three ways to pump. °· You can use your hand to massage and compress your breast. °· You can use a handheld manual pump. °· You can use an electric pump.   °· Make sure the suction cup (flange) on the breast pump is the right size. Place the flange directly over the nipple. If it is the wrong size or placed the wrong way, it may be painful and cause nipple damage.   °· If pumping is uncomfortable, apply a small amount of purified or modified lanolin to your nipple and areola. °· If you are using an electric pump, adjust the speed and suction power to be more comfortable. °· If pumping is painful or if you are not getting very much milk, you may need a different type of pump. A lactation consultant can help you determine what type of pump to use.   °· Keep a full water bottle near you at all times. Drinking lots of fluid helps you make more milk.  °· You can store your milk to use later. Pumped breast milk can be stored in a sealable, sterile container or plastic bag. Label all stored breast milk with the date you pumped it. °· Milk can stay out at room temperature for up to 8 hours. °·   You can store your milk in the refrigerator for up to 8 days. °¨ You can store your milk in the freezer for 3 months. Thaw frozen milk using warm water. Do not put it in the microwave. °· Do not smoke. Smoking can lower your milk supply and harm your infant. If you need help quitting, ask your health care provider to recommend a program.   °WHEN SHOULD I CALL MY HEALTH CARE PROVIDER OR A LACTATION CONSULTANT? °· You are having trouble pumping. °· You are concerned that you are not making enough milk. °· You have nipple pain, soreness, or redness. °· You want to use birth control. Birth control pills may lower your milk supply. Talk to your health care provider about your options. °Document Released: 02/23/2010 Document Revised: 09/10/2013 Document Reviewed:  06/28/2013 °Exit[employer]® Patient Information ©2015 [employer]. This information is not intended to replace advice given to you by your health care provider. Make sure you discuss any questions you have with your health care provider. ° °Nutrition for the New Mother  °A new mother needs good health and nutrition so she can have energy to take care of a new baby. Whether a mother breastfeeds or formula feeds the baby, it is important to have a well-balanced diet. Foods from all the food groups should be chosen to meet the new mother's energy needs and to give her the nutrients needed for repair and healing.  °A HEALTHY EATING PLAN °The My [employer]ram[employer] plan for Moms outlines what you should eat to help you and your baby stay healthy. The energy and amount of food you need depends on whether or not you are breastfeeding. If you are breastfeeding you will need more nutrients. If you choose not to breastfeed, your nutrition goal should be to return to a healthy weight. Limiting calories may be needed if you are not breastfeeding.  °HOME CARE INSTRUCTIONS  °· For a personal plan based on your unique needs, see your Registered Dietitian or visit www.mypyramid.gov. °· Eat a variety of foods. The plan below will help guide you. The following chart has a suggested daily meal plan from the [employer] for Moms. °· Eat a variety of fruits and vegetables. °· Eat more dark green and orange vegetables and cooked dried beans. °· Make half your grains whole grains. Choose whole instead of refined grains. °· Choose low-fat or lean meats and poultry. °· Choose low-fat or fat-free dairy products like milk, cheese, or yogurt. °Fruits °· Breastfeeding: 2 cups °· Non-Breastfeeding: 2 cups °· What Counts as a serving? °¨ 1 cup of fruit or juice. °¨ ½ cup dried fruit. °Vegetables °· Breastfeeding: 3 cups °· Non-Breastfeeding: 2 ½ cups °· What Counts as a serving? °¨ 1 cup raw or cooked vegetables. °¨ Juice or 2 cups raw leafy  vegetables. °Grains °· Breastfeeding: 8 oz °· Non-Breastfeeding: 6 oz °· What Counts as a serving? °¨ 1 slice bread. °¨ 1 oz ready-to-eat cereal. °¨ ½ cup cooked pasta, rice, or cereal. °Meat and Beans °· Breastfeeding: 6 ½ oz °· Non-Breastfeeding: 5 ½ oz °· What Counts as a serving? °¨ 1 oz lean meat, poultry, or fish °¨ ¼ cup cooked dry beans °¨ ½ oz nuts or 1 egg °¨ 1 tbs peanut butter °Milk °· Breastfeeding: 3 cups °· Non-Breastfeeding: 3 cups °· What Counts as a serving? °¨ 1 cup milk. °¨ 8 oz yogurt. °¨ 1 ½ oz cheese. °¨ 2 oz processed cheese. °TIPS FOR THE BREASTFEEDING MOM °· Rapid weight   loss is not suggested when you are breastfeeding. By simply breastfeeding, you will be able to lose the weight gained during your pregnancy. Your caregiver can keep track of your weight and tell you if your weight loss is appropriate. °· Be sure to drink fluids. You may notice that you are thirstier than usual. A suggestion is to drink a glass of water or other beverage whenever you breastfeed. °· Avoid alcohol as it can be passed into your breast milk. °· Limit caffeine drinks to no more than 2 to 3 cups per day. °· You may need to keep taking your prenatal vitamin while you are breastfeeding. Talk with your caregiver about taking a vitamin or supplement. °RETURING TO A HEALTHY WEIGHT °· The My [employer] Plan for Moms will help you return to a healthy weight. It will also provide the nutrients you need. °· You may need to limit "empty" calories. These include: °¨ High fat foods like fried foods, fatty meats, fast food, butter, and mayonnaise. °¨ High sugar foods like sodas, jelly, candy, and sweets. °· Be physically active. Include 30 minutes of exercise or more each day. Choose an activity you like such as walking, swimming, biking, or aerobics. Check with your caregiver before you start to exercise. °Document Released: 12/13/2007 Document Revised: 11/28/2011 Document Reviewed: 12/13/2007 °ExitCare® Patient Information  ©2015 [employer]. This information is not intended to replace advice given to you by your health care provider. Make sure you discuss any questions you have with your health care provider. °Postpartum Depression and Baby Blues °The postpartum period begins right after the birth of a baby. During this time, there is often a great amount of joy and excitement. It is also a time of many changes in the life of the parents. Regardless of how many times a mother gives birth, each child brings new challenges and dynamics to the family. It is not unusual to have feelings of excitement along with confusing shifts in moods, emotions, and thoughts. All mothers are at risk of developing postpartum depression or the "baby blues." These mood changes can occur right after giving birth, or they may occur many months after giving birth. The baby blues or postpartum depression can be mild or severe. Additionally, postpartum depression can go away rather quickly, or it can be a long-term condition.  °CAUSES °Raised hormone levels and the rapid drop in those levels are thought to be a main cause of postpartum depression and the baby blues. A number of hormones change during and after pregnancy. Estrogen and progesterone usually decrease right after the delivery of your baby. The levels of thyroid hormone and various cortisol steroids also rapidly drop. Other factors that play a role in these mood changes include major life events and genetics.  °RISK FACTORS °If you have any of the following risks for the baby blues or postpartum depression, know what symptoms to watch out for during the postpartum period. Risk factors that may increase the likelihood of getting the baby blues or postpartum depression include: °· Having a personal or family history of depression.   °· Having depression while being pregnant.   °· Having premenstrual mood issues or mood issues related to oral contraceptives. °· Having a lot of life stress.   °· Having  marital conflict.   °· Lacking a social support network.   °· Having a baby with special needs.   °· Having health problems, such as diabetes.   °SIGNS AND SYMPTOMS °Symptoms of baby blues include: °· Brief changes in mood, such as going   from extreme happiness to sadness. °· Decreased concentration.   °· Difficulty sleeping.   °· Crying spells, tearfulness.   °· Irritability.   °· Anxiety.   °Symptoms of postpartum depression typically begin within the first month after giving birth. These symptoms include: °· Difficulty sleeping or excessive sleepiness.   °· Marked weight loss.   °· Agitation.   °· Feelings of worthlessness.   °· Lack of interest in activity or food.   °Postpartum psychosis is a very serious condition and can be dangerous. Fortunately, it is rare. Displaying any of the following symptoms is cause for immediate medical attention. Symptoms of postpartum psychosis include:  °· Hallucinations and delusions.   °· Bizarre or disorganized behavior.   °· Confusion or disorientation.   °DIAGNOSIS  °A diagnosis is made by an evaluation of your symptoms. There are no medical or lab tests that lead to a diagnosis, but there are various questionnaires that a health care provider may use to identify those with the baby blues, postpartum depression, or psychosis. Often, a screening tool called the Edinburgh Postnatal Depression Scale is used to diagnose depression in the postpartum period.  °TREATMENT °The baby blues usually goes away on its own in 1-2 weeks. Social support is often all that is needed. You will be encouraged to get adequate sleep and rest. Occasionally, you may be given medicines to help you sleep.  °Postpartum depression requires treatment because it can last several months or longer if it is not treated. Treatment may include individual or group therapy, medicine, or both to address any social, physiological, and psychological factors that may play a role in the depression. Regular exercise, a  healthy diet, rest, and social support may also be strongly recommended.  °Postpartum psychosis is more serious and needs treatment right away. Hospitalization is often needed. °HOME CARE INSTRUCTIONS °· Get as much rest as you can. Nap when the baby sleeps.   °· Exercise regularly. Some women find yoga and walking to be beneficial.   °· Eat a balanced and nourishing diet.   °· Do little things that you enjoy. Have a cup of tea, take a bubble bath, read your favorite magazine, or listen to your favorite music. °· Avoid alcohol.   °· Ask for help with household chores, cooking, grocery shopping, or running errands as needed. Do not try to do everything.   °· Talk to people close to you about how you are feeling. Get support from your partner, family members, friends, or other new moms. °· Try to stay positive in how you think. Think about the things you are grateful for.   °· Do not spend a lot of time alone.   °· Only take over-the-counter or prescription medicine as directed by your health care provider. °· Keep all your postpartum appointments.   °· Let your health care provider know if you have any concerns.   °SEEK MEDICAL CARE IF: °You are having a reaction to or problems with your medicine. °SEEK IMMEDIATE MEDICAL CARE IF: °· You have suicidal feelings.   °· You think you may harm the baby or someone else. °MAKE SURE YOU: °· Understand these instructions. °· Will watch your condition. °· Will get help right away if you are not doing well or get worse. °Document Released: 06/09/2004 Document Revised: 09/10/2013 Document Reviewed: 06/17/2013 °ExitCare® Patient Information ©2015 [employer]. This information is not intended to replace advice given to you by your health care provider. Make sure you discuss any questions you have with your health care provider. °Postpartum Care After Vaginal Delivery °After you deliver your newborn (postpartum period), the usual stay in   the hospital is 24-72 hours. If there were  problems with your labor or delivery, or if you have other medical problems, you might be in the hospital longer.  °While you are in the hospital, you will receive help and instructions on how to care for yourself and your newborn during the postpartum period.  °While you are in the hospital: °· Be sure to tell your nurses if you have pain or discomfort, as well as where you feel the pain and what makes the pain worse. °· If you had an incision made near your vagina (episiotomy) or if you had some tearing during delivery, the nurses may put ice packs on your episiotomy or tear. The ice packs may help to reduce the pain and swelling. °· If you are breastfeeding, you may feel uncomfortable contractions of your uterus for a couple of weeks. This is normal. The contractions help your uterus get back to normal size. °· It is normal to have some bleeding after delivery. °· For the first 1-3 days after delivery, the flow is red and the amount may be similar to a period. °· It is common for the flow to start and stop. °· In the first few days, you may pass some small clots. Let your nurses know if you begin to pass large clots or your flow increases. °· Do not  flush blood clots down the toilet before having the nurse look at them. °· During the next 3-10 days after delivery, your flow should become more watery and pink or brown-tinged in color. °· Ten to fourteen days after delivery, your flow should be a small amount of yellowish-white discharge. °· The amount of your flow will decrease over the first few weeks after delivery. Your flow may stop in 6-8 weeks. Most women have had their flow stop by 12 weeks after delivery. °· You should change your sanitary pads frequently. °· Wash your hands thoroughly with soap and water for at least 20 seconds after changing pads, using the toilet, or before holding or feeding your newborn. °· You should feel like you need to empty your bladder within the first 6-8 hours after  delivery. °· In case you become weak, lightheaded, or faint, call your nurse before you get out of bed for the first time and before you take a shower for the first time. °· Within the first few days after delivery, your breasts may begin to feel tender and full. This is called engorgement. Breast tenderness usually goes away within 48-72 hours after engorgement occurs. You may also notice milk leaking from your breasts. If you are not breastfeeding, do not stimulate your breasts. Breast stimulation can make your breasts produce more milk. °· Spending as much time as possible with your newborn is very important. During this time, you and your newborn can feel close and get to know each other. Having your newborn stay in your room (rooming in) will help to strengthen the bond with your newborn.  It will give you time to get to know your newborn and become comfortable caring for your newborn. °· Your hormones change after delivery. Sometimes the hormone changes can temporarily cause you to feel sad or tearful. These feelings should not last more than a few days. If these feelings last longer than that, you should talk to your caregiver. °· If desired, talk to your caregiver about methods of family planning or contraception. °· Talk to your caregiver about immunizations. Your caregiver may want you to have the   following immunizations before leaving the hospital: °· Tetanus, diphtheria, and pertussis (Tdap) or tetanus and diphtheria (Td) immunization. It is very important that you and your family (including grandparents) or others caring for your newborn are up-to-date with the Tdap or Td immunizations. The Tdap or Td immunization can help protect your newborn from getting ill. °· Rubella immunization. °· Varicella (chickenpox) immunization. °· Influenza immunization. You should receive this annual immunization if you did not receive the immunization during your pregnancy. °Document Released: 07/03/2007 Document  Revised: 05/30/2012 Document Reviewed: 05/02/2012 °[employer]® Patient Information ©2015 [employer]. This information is not intended to replace advice given to you by your health care provider. Make sure you discuss any questions you have with your health care provider. °Breastfeeding and Mastitis °Mastitis is inflammation of the breast tissue. It can occur in women who are breastfeeding. This can make breastfeeding painful. Mastitis will sometimes go away on its own. Your health care provider will help determine if treatment is needed. °CAUSES °Mastitis is often associated with a blocked milk (lactiferous) duct. This can happen when too much milk builds up in the breast. Causes of excess milk in the breast can include: °· Poor latch-on. If your baby is not latched onto the breast properly, she or he may not empty your breast completely while breastfeeding. °· Allowing too much time to pass between feedings. °· Wearing a bra or other clothing that is too tight. This puts extra pressure on the lactiferous ducts so milk does not flow through them as it should. °Mastitis can also be caused by a bacterial infection. Bacteria may enter the breast tissue through cuts or openings in the skin. In women who are breastfeeding, this may occur because of cracked or irritated skin. Cracks in the skin are often caused when your baby does not latch on properly to the breast. °SIGNS AND SYMPTOMS °· Swelling, redness, tenderness, and pain in an area of the breast. °· Swelling of the glands under the arm on the same side. °· Fever may or may not accompany mastitis. °If an infection is allowed to progress, a collection of pus (abscess) may develop. °DIAGNOSIS  °Your health care provider can usually diagnose mastitis based on your symptoms and a physical exam. Tests may be done to help confirm the diagnosis. These may include: °· Removal of pus from the breast by applying pressure to the area. This pus can be examined in the lab to  determine which bacteria are present. If an abscess has developed, the fluid in the abscess can be removed with a needle. This can also be used to confirm the diagnosis and determine the bacteria present. In most cases, pus will not be present. °· Blood tests to determine if your body is fighting a bacterial infection. °· Mammogram or ultrasound tests to rule out other problems or diseases. °TREATMENT  °Mastitis that occurs with breastfeeding will sometimes go away on its own. Your health care provider may choose to wait 24 hours after first seeing you to decide whether a prescription medicine is needed. If your symptoms are worse after 24 hours, your health care provider will likely prescribe an antibiotic medicine to treat the mastitis. He or she will determine which bacteria are most likely causing the infection and will then select an appropriate antibiotic medicine. This is sometimes changed based on the results of tests performed to identify the bacteria, or if there is no response to the antibiotic medicine selected. Antibiotic medicines are usually given by mouth. You   may also be given medicine for pain. °HOME CARE INSTRUCTIONS °· Only take over-the-counter or prescription medicines for pain, fever, or discomfort as directed by your health care provider. °· If your health care provider prescribed an antibiotic medicine, take the medicine as directed. Make sure you finish it even if you start to feel better. °· Do not wear a tight or underwire bra. Wear a soft, supportive bra. °· Increase your fluid intake, especially if you have a fever. °· Continue to empty the breast. Your health care provider can tell you whether this milk is safe for your infant or needs to be thrown out. You may be told to stop nursing until your health care provider thinks it is safe for your baby. Use a breast pump if you are advised to stop nursing. °· Keep your nipples clean and dry. °· Empty the first breast completely before going  to the other breast. If your baby is not emptying your breasts completely for some reason, use a breast pump to empty your breasts. °· If you go back to work, pump your breasts while at work to stay in time with your nursing schedule. °· Avoid allowing your breasts to become overly filled with milk (engorged). °SEEK MEDICAL CARE IF: °· You have pus-like discharge from the breast. °· Your symptoms do not improve with the treatment prescribed by your health care provider within 2 days. °SEEK IMMEDIATE MEDICAL CARE IF: °· Your pain and swelling are getting worse. °· You have pain that is not controlled with medicine. °· You have a red line extending from the breast toward your armpit. °· You have a fever or persistent symptoms for more than 2-3 days. °· You have a fever and your symptoms suddenly get worse. °MAKE SURE YOU:  °· Understand these instructions. °· Will watch your condition. °· Will get help right away if you are not doing well or get worse. °Document Released: 12/31/2004 Document Revised: 09/10/2013 Document Reviewed: 04/11/2013 °[employer]® Patient Information ©2015 [employer]. This information is not intended to replace advice given to you by your health care provider. Make sure you discuss any questions you have with your health care provider. °Breastfeeding °Deciding to breastfeed is one of the best choices you can make for you and your baby. A change in hormones during pregnancy causes your breast tissue to grow and increases the number and size of your milk ducts. These hormones also allow proteins, sugars, and fats from your blood supply to make breast milk in your milk-producing glands. Hormones prevent breast milk from being released before your baby is born as well as prompt milk flow after birth. Once breastfeeding has begun, thoughts of your baby, as well as his or her sucking or crying, can stimulate the release of milk from your milk-producing glands.  °BENEFITS OF BREASTFEEDING °For Your  Baby °· Your first milk (colostrum) helps your baby's digestive system function better.   °· There are antibodies in your milk that help your baby fight off infections.   °· Your baby has a lower incidence of asthma, allergies, and sudden infant death syndrome.   °· The nutrients in breast milk are better for your baby than infant formulas and are designed uniquely for your baby's needs.   °· Breast milk improves your baby's brain development.   °· Your baby is less likely to develop other conditions, such as childhood obesity, asthma, or type 2 diabetes mellitus.   °For You  °· Breastfeeding helps to create a very special bond between you and   your baby.   °· Breastfeeding is convenient. Breast milk is always available at the correct temperature and costs nothing.   °· Breastfeeding helps to burn calories and helps you lose the weight gained during pregnancy.   °· Breastfeeding makes your uterus contract to its prepregnancy size faster and slows bleeding (lochia) after you give birth.   °· Breastfeeding helps to lower your risk of developing type 2 diabetes mellitus, osteoporosis, and breast or ovarian cancer later in life. °SIGNS THAT YOUR BABY IS HUNGRY °Early Signs of Hunger  °· Increased alertness or activity. °· Stretching. °· Movement of the head from side to side. °· Movement of the head and opening of the mouth when the corner of the mouth or cheek is stroked (rooting). °· Increased sucking sounds, smacking lips, cooing, sighing, or squeaking. °· Hand-to-mouth movements. °· Increased sucking of fingers or hands. °Late Signs of Hunger °· Fussing. °· Intermittent crying. °Extreme Signs of Hunger °Signs of extreme hunger will require calming and consoling before your baby will be able to breastfeed successfully. Do not wait for the following signs of extreme hunger to occur before you initiate breastfeeding:   °· Restlessness. °· A loud, strong cry. °·  Screaming. °BREASTFEEDING BASICS °Breastfeeding  Initiation °· Find a comfortable place to sit or lie down, with your neck and back well supported. °· Place a pillow or rolled up blanket under your baby to bring him or her to the level of your breast (if you are seated). Nursing pillows are specially designed to help support your arms and your baby while you breastfeed. °· Make sure that your baby's abdomen is facing your abdomen.   °· Gently massage your breast. With your fingertips, massage from your chest wall toward your nipple in a circular motion. This encourages milk flow. You may need to continue this action during the feeding if your milk flows slowly. °· Support your breast with 4 fingers underneath and your thumb above your nipple. Make sure your fingers are well away from your nipple and your baby's mouth.   °· Stroke your baby's lips gently with your finger or nipple.   °· When your baby's mouth is open wide enough, quickly bring your baby to your breast, placing your entire nipple and as much of the colored area around your nipple (areola) as possible into your baby's mouth.   °¨ More areola should be visible above your baby's upper lip than below the lower lip.   °¨ Your baby's tongue should be between his or her lower gum and your breast.   °· Ensure that your baby's mouth is correctly positioned around your nipple (latched). Your baby's lips should create a seal on your breast and be turned out (everted). °· It is common for your baby to suck about 2-3 minutes in order to start the flow of breast milk. °Latching °Teaching your baby how to latch on to your breast properly is very important. An improper latch can cause nipple pain and decreased milk supply for you and poor weight gain in your baby. Also, if your baby is not latched onto your nipple properly, he or she may swallow some air during feeding. This can make your baby fussy. Burping your baby when you switch breasts during the feeding can help to get rid of the air. However, teaching your  baby to latch on properly is still the best way to prevent fussiness from swallowing air while breastfeeding. °Signs that your baby has successfully latched on to your nipple:    °· Silent tugging or silent sucking, without   causing you pain.   °· Swallowing heard between every 3-4 sucks.   °·  Muscle movement above and in front of his or her ears while sucking.   °Signs that your baby has not successfully latched on to nipple:  °· Sucking sounds or smacking sounds from your baby while breastfeeding. °· Nipple pain. °If you think your baby has not latched on correctly, slip your finger into the corner of your baby's mouth to break the suction and place it between your baby's gums. Attempt breastfeeding initiation again. °Signs of Successful Breastfeeding °Signs from your baby:   °· A gradual decrease in the number of sucks or complete cessation of sucking.   °· Falling asleep.   °· Relaxation of his or her body.   °· Retention of a small amount of milk in his or her mouth.   °· Letting go of your breast by himself or herself. °Signs from you: °· Breasts that have increased in firmness, weight, and size 1-3 hours after feeding.   °· Breasts that are softer immediately after breastfeeding. °· Increased milk volume, as well as a change in milk consistency and color by the fifth day of breastfeeding.   °· Nipples that are not sore, cracked, or bleeding. °Signs That Your Baby is Getting Enough Milk °· Wetting at least 3 diapers in a 24-hour period. The urine should be clear and pale yellow by age 5 days. °· At least 3 stools in a 24-hour period by age 5 days. The stool should be soft and yellow. °· At least 3 stools in a 24-hour period by age 7 days. The stool should be seedy and yellow. °· No loss of weight greater than 10% of birth weight during the first 3 days of age. °· Average weight gain of 4-7 ounces (113-198 g) per week after age 4 days. °· Consistent daily weight gain by age 5 days, without weight loss after the  age of 2 weeks. °After a feeding, your baby may spit up a small amount. This is common. °BREASTFEEDING FREQUENCY AND DURATION °Frequent feeding will help you make more milk and can prevent sore nipples and breast engorgement. Breastfeed when you feel the need to reduce the fullness of your breasts or when your baby shows signs of hunger. This is called "breastfeeding on demand." Avoid introducing a pacifier to your baby while you are working to establish breastfeeding (the first 4-6 weeks after your baby is born). After this time you may choose to use a pacifier. Research has shown that pacifier use during the first year of a baby's life decreases the risk of sudden infant death syndrome (SIDS). °Allow your baby to feed on each breast as long as he or she wants. Breastfeed until your baby is finished feeding. When your baby unlatches or falls asleep while feeding from the first breast, offer the second breast. Because newborns are often sleepy in the first few weeks of life, you may need to awaken your baby to get him or her to feed. °Breastfeeding times will vary from baby to baby. However, the following rules can serve as a guide to help you ensure that your baby is properly fed: °· Newborns (babies 4 weeks of age or younger) may breastfeed every 1-3 hours. °· Newborns should not go longer than 3 hours during the day or 5 hours during the night without breastfeeding. °· You should breastfeed your baby a minimum of 8 times in a 24-hour period until you begin to introduce solid foods to your baby at around 6 months of   age. °BREAST MILK PUMPING °Pumping and storing breast milk allows you to ensure that your baby is exclusively fed your breast milk, even at times when you are unable to breastfeed. This is especially important if you are going back to work while you are still breastfeeding or when you are not able to be present during feedings. Your lactation consultant can give you guidelines on how long it is safe to  store breast milk.  °A breast pump is a machine that allows you to pump milk from your breast into a sterile bottle. The pumped breast milk can then be stored in a refrigerator or freezer. Some breast pumps are operated by hand, while others use electricity. Ask your lactation consultant which type will work best for you. Breast pumps can be purchased, but some hospitals and breastfeeding support groups lease breast pumps on a monthly basis. A lactation consultant can teach you how to hand express breast milk, if you prefer not to use a pump.  °CARING FOR YOUR BREASTS WHILE YOU BREASTFEED °Nipples can become dry, cracked, and sore while breastfeeding. The following recommendations can help keep your breasts moisturized and healthy: °· Avoid using soap on your nipples.   °· Wear a supportive bra. Although not required, special nursing bras and tank tops are designed to allow access to your breasts for breastfeeding without taking off your entire bra or top. Avoid wearing underwire-style bras or extremely tight bras. °· Air dry your nipples for 3-4 minutes after each feeding.   °· Use only cotton bra pads to absorb leaked breast milk. Leaking of breast milk between feedings is normal.   °· Use lanolin on your nipples after breastfeeding. Lanolin helps to maintain your skin's normal moisture barrier. If you use pure lanolin, you do not need to wash it off before feeding your baby again. Pure lanolin is not toxic to your baby. You may also hand express a few drops of breast milk and gently massage that milk into your nipples and allow the milk to air dry. °In the first few weeks after giving birth, some women experience extremely full breasts (engorgement). Engorgement can make your breasts feel heavy, warm, and tender to the touch. Engorgement peaks within 3-5 days after you give birth. The following recommendations can help ease engorgement: °· Completely empty your breasts while breastfeeding or pumping. You may want  to start by applying warm, moist heat (in the shower or with warm water-soaked hand towels) just before feeding or pumping. This increases circulation and helps the milk flow. If your baby does not completely empty your breasts while breastfeeding, pump any extra milk after he or she is finished. °· Wear a snug bra (nursing or regular) or tank top for 1-2 days to signal your body to slightly decrease milk production. °· Apply ice packs to your breasts, unless this is too uncomfortable for you. °· Make sure that your baby is latched on and positioned properly while breastfeeding. °If engorgement persists after 48 hours of following these recommendations, contact your health care provider or a lactation consultant. °OVERALL HEALTH CARE RECOMMENDATIONS WHILE BREASTFEEDING °· Eat healthy foods. Alternate between meals and snacks, eating 3 of each per day. Because what you eat affects your breast milk, some of the foods may make your baby more irritable than usual. Avoid eating these foods if you are sure that they are negatively affecting your baby. °· Drink milk, fruit juice, and water to satisfy your thirst (about 10 glasses a day).   °· Rest often, relax,   and continue to take your prenatal vitamins to prevent fatigue, stress, and anemia. °· Continue breast self-awareness checks. °· Avoid chewing and smoking tobacco. °· Avoid alcohol and drug use. °Some medicines that may be harmful to your baby can pass through breast milk. It is important to ask your health care provider before taking any medicine, including all over-the-counter and prescription medicine as well as vitamin and herbal supplements. °It is possible to become pregnant while breastfeeding. If birth control is desired, ask your health care provider about options that will be safe for your baby. °SEEK MEDICAL CARE IF:  °· You feel like you want to stop breastfeeding or have become frustrated with breastfeeding. °· You have painful breasts or  nipples. °· Your nipples are cracked or bleeding. °· Your breasts are red, tender, or warm. °· You have a swollen area on either breast. °· You have a fever or chills. °· You have nausea or vomiting. °· You have drainage other than breast milk from your nipples. °· Your breasts do not become full before feedings by the fifth day after you give birth. °· You feel sad and depressed. °· Your baby is too sleepy to eat well. °· Your baby is having trouble sleeping.   °· Your baby is wetting less than 3 diapers in a 24-hour period. °· Your baby has less than 3 stools in a 24-hour period. °· Your baby's skin or the white part of his or her eyes becomes yellow.   °· Your baby is not gaining weight by 5 days of age. °SEEK IMMEDIATE MEDICAL CARE IF:  °· Your baby is overly tired (lethargic) and does not want to wake up and feed. °· Your baby develops an unexplained fever. °Document Released: 09/05/2005 Document Revised: 09/10/2013 Document Reviewed: 02/27/2013 °[employer]® Patient Information ©2015 [employer]. This information is not intended to replace advice given to you by your health care provider. Make sure you discuss any questions you have with your health care provider. ° °

## 2015-06-12 NOTE — Anesthesia Postprocedure Evaluation (Signed)
  Anesthesia Post-op Note  Patient: Sydney Little  Procedure(s) Performed: * No procedures listed *  Patient Location: Mother/Baby  Anesthesia Type:Epidural  Level of Consciousness: awake and alert   Airway and Oxygen Therapy: Patient Spontanous Breathing  Post-op Pain: mild  Post-op Assessment: Post-op Vital signs reviewed, Patient's Cardiovascular Status Stable, Respiratory Function Stable, No signs of Nausea or vomiting, Pain level controlled, No headache, Spinal receding and Patient able to bend at knees              Post-op Vital Signs: Reviewed  Last Vitals:  Filed Vitals:   06/12/15 0610  BP: 114/59  Pulse: 78  Temp: 36.5 C  Resp: 16    Complications: No apparent anesthesia complications

## 2015-06-12 NOTE — Discharge Summary (Signed)
Obstetric Discharge Summary Reason for Admission: onset of labor Prenatal Procedures: ultrasound Intrapartum Procedures: spontaneous vaginal delivery Postpartum Procedures: none Complications-Operative and Postpartum: none HEMOGLOBIN  Date Value Ref Range Status  06/12/2015 9.8* 12.0 - 15.0 g/dL Final   HCT  Date Value Ref Range Status  06/12/2015 30.6* 36.0 - 46.0 % Final    Physical Exam:  General: alert, cooperative and no distress Lochia: appropriate Uterine Fundus: firm, midline, U-1 DVT Evaluation: No evidence of DVT seen on physical exam. Negative Homan's sign. No cords or calf tenderness. No significant calf/ankle edema.  Discharge Diagnoses: Term Pregnancy-delivered  Discharge Information: Date: 06/12/2015 Activity: pelvic rest Diet: routine Medications: PNV and Ibuprofen Condition: stable Instructions: refer to practice specific booklet Discharge to: home Follow-up Information    Follow up with MODY,VAISHALI R, MD. Schedule an appointment as soon as possible for a visit in 6 weeks.   Specialty:  Obstetrics and Gynecology   Why:  postpartum visit   Contact information:   [place] [address] [phone]       Newborn Data: Live born female on 06/11/2014 Birth Weight: 7 lb 12.2 oz (3521 g) APGAR: 8, 9  Home with mother.  Laury Deep, M MSN, CNM 06/12/2015, 12:41 PM

## 2015-06-12 NOTE — Progress Notes (Signed)
Patient ID: Sydney Little, female   DOB: 1980/12/10, 34 y.o.   MRN: [id number] PPD # 1 SVD  S:  Reports feeling well             Tolerating po/ No nausea or vomiting             Bleeding is light             Pain controlled with ibuprofen (OTC)             Up ad lib / ambulatory / voiding without difficulties    Newborn  Information for the patient's newborn:  [name], [name] [[id number]]  female  breast feeding  / Circumcision done   O:  A & O x 3, in no apparent distress              VS:  Filed Vitals:   06/11/15 1635 06/11/15 1740 06/11/15 2125 06/12/15 0610  BP: 118/71 117/70 114/55 114/59  Pulse: 58 72 65 78  Temp: 97.9 F (36.6 C) 98 F (36.7 C) 97.9 F (36.6 C) 97.7 F (36.5 C)  TempSrc: Oral Oral Oral Oral  Resp: 18 16 16 16   Height:      Weight:      SpO2:   99% 98%    LABS:  Recent Labs  06/11/15 0745 06/12/15 0520  WBC 8.6 11.2*  HGB 11.5* 9.8*  HCT 35.6* 30.6*  PLT 137* 119*    Blood type: B POS (09/22 0745)  Rubella: Immune (02/15 0000)   I&O: I/O last 3 completed shifts: In: -  Out: 1000 [Urine:600; Blood:400]             Lungs: Clear and unlabored  Heart: regular rate and rhythm / no murmurs  Abdomen: soft, non-tender, non-distended              Fundus: firm, non-tender, U-1  Perineum: Intact - no edema  Lochia: minimal  Extremities: No edema, no calf pain or tenderness, No Homans    A/P: PPD # 1  34 y.o., [id number]   Principal Problem:    Postpartum care following vaginal delivery (9/22)  Active Problems:    Pregnancy    Uterine fibroid in pregnancy    SVD (spontaneous vaginal delivery)    Acute blood loss anemia   Doing well - stable status  Routine post partum orders  Early Discharge Home today  Laury Deep, M, MSN, CNM 06/12/2015, 12:37 PM

## 2015-06-17 ENCOUNTER — Ambulatory Visit (HOSPITAL_COMMUNITY)
Admission: RE | Admit: 2015-06-17 | Discharge: 2015-06-17 | Disposition: A | Discharge status: Home / Self Care | Payer: 59 | Source: Ambulatory Visit | Attending: Obstetrics & Gynecology | Admitting: Obstetrics & Gynecology

## 2015-06-17 NOTE — Lactation Note (Signed)
Lactation Consult  Mother's reason for visit:  Check pre- post weight , and to make sure BF is going well  Visit Type:  Feeding assessment  Appointment Notes:  Sore nipples  Consult:  Initial Lactation Consultant:  Myer Haff  ________________________________________________________________________ Sharene Skeans Name: Tamsen Roers O'Berry Date of Birth: 06/11/2014 Pediatrician: Dr. Burt Knack  Gender: female Gestational Age: [redacted]w[redacted]d (At Birth) Birth Weight: 7 lb 12.2 oz (3521 g) Weight at Discharge: Weight: 7 lb 10.8 oz (3480 g)Date of Discharge: 06/12/2015 [facility] Weights   06/11/15 1441 06/12/15 0004  Weight: 7 lb 12.2 oz (3521 g) 7 lb 10.8 oz (3480 g)   Last weight taken from location outside of [facility]: 9/26 7-5.5 oz  Location:Pediatrician's office  Today's weight-3496 g , 7-11.3 oz   ______________________________________________________________________  Mother's Name: Pearla Dubonnet Type of delivery:  Vaginal Delivery  Breastfeeding Experience:  Experienced breast feeder - BF 1st baby 14 months after a difficult start  Maternal Medical Conditions:  No risk  Maternal Medications:  PNV   ________________________________________________________________________  Breastfeeding History (Post Discharge) - milk came in Sunday to Monday , sore nipples before milk came in  And continue to increase to the point I couldn't tolerate latching. Kept pumping while the baby received a bottle  And then switched back to breast . Soreness is improving.   Frequency of breastfeeding:  Every 2 hours during the day , longer at night  Duration of feeding:  15-25 mins , and cluster feedings for snacks   Supplementing: per mom - Yes , only when I was to sore to breast feed  To protect milk supply- 2 oz each breast  Supplemented with some formula until milk came in ( not now )   Pumping: DEBP - Medela 2 oz each breast until yesterday am , when I was able to re-latch    Infant Intake and Output Assessment  Voids:  8-10  in 24 hrs.  Color:  Clear yellow Stools:  8-10  in 24 hrs.  Color:  Yellow ( seedy )   ________________________________________________________________________  Maternal Breast Assessment  Breast:  Full Nipple:  Erect Pain level:  0 Pain interventions:  Expressed breast milk  _______________________________________________________________________ Feeding Assessment/Evaluation- baby awake and hungry , color pink , and well hydrated   Initial feeding assessment:  Infant's oral assessment:  WNL  Positioning:  Cradle Left breast  LATCH documentation:  Latch:  2 = Grasps breast easily, tongue down, lips flanged, rhythmical sucking.  Audible swallowing:  2 = Spontaneous and intermittent  Type of nipple:  2 = Everted at rest and after stimulation  Comfort (Breast/Nipple):  1 = Filling, red/small blisters or bruises, mild/mod discomfort  Hold (Positioning):  1 = Assistance needed to correctly position infant at breast and maintain latch  LATCH score:  8  Minimal assist by LC   Attached assessment:  Deep  Lips flanged:  No.  Lips untucked:  Yes.    Suck assessment:  Nutritive  Tools:  None  Instructed on use and cleaning of tool:  No.  Pre-feed weight:  3496 g  (7 lb. 11.3  oz.) Post-feed weight:  3560 g (7 lb. 13.6 oz.) Amount transferred:  64 ml Amount supplemented:none   Additional Feeding Assessment -   Infant's oral assessment:  WNL  Positioning:  Football Right breast  LATCH documentation:  Latch:  2 = Grasps breast easily, tongue down, lips flanged, rhythmical sucking.  Audible swallowing:  2 = Spontaneous and intermittent  Type of nipple:  2 = Everted at rest and after stimulation  Comfort (Breast/Nipple):  1 = Filling, red/small blisters or bruises, mild/mod discomfort  Hold (Positioning):  1 = Assistance needed to correctly position infant at breast and maintain latch  LATCH score:  8  Attached  assessment:  Deep  Lips flanged:  No.  Lips untucked:  Yes.    Suck assessment:  Nutritive  Tools:  None  Instructed on use and cleaning of tool:  No.   Stool diaper changed- re-weight   Pre-feed weight: 3552  g  (7 lb. 13.3  oz.) Post-feed weight:  3566 g (7 lb. 13.8  oz.) Amount transferred:  14  ml Amount supplemented:  None   Total amount pumped post feed: mom did not post pump ( didn't need to )   Total amount transferred:  78 ml / Excellent for a 6 day old infant  Total supplement given:  None   Lactation Impression :  Mom presented with baby for feeding assessment and to check resolving sore nipples  Nipples are healing and appear healthy  Baby latches well both breast , and was hungry , last feeding at home 2 1/2 hours ago. Baby sustained a depth latch with multiply swallows , increased with breast compressions. Baby released after both breast , satisfied  Nipples appeared normal shape and mom was comfortable with both latches. LC reviewed basics and [prevention of soreness. See LC plan below )   Lactation Plan :  Per mom will F/U Friday 9/30 for weight check with [facility]  Praised mom for her efforts breast feeding Feedings- every 2 1/2 - 3 hours and with feeding cues  Average feeding 15 -20 mins  Growth Spurts - 7-10 days , 3 week , 6 weeks, 3 months . 6 months , etc. Cluster feeding is normal  Steps for latching breast massage ,hand express, pre-pump if needed 10 -15 ml , Latch with firm support , breast compressions until comfort achieved  Alternate between at least 2 different positions  Engorgement prevention and tx - Full = good sign , if > full heading to snug, firm - heading toward engorgement - release down   Mom receptive to [place]

## 2015-09-28 DIAGNOSIS — H6500 Acute serous otitis media, unspecified ear: Secondary | ICD-10-CM | POA: Diagnosis not present

## 2015-09-28 DIAGNOSIS — H6120 Impacted cerumen, unspecified ear: Secondary | ICD-10-CM | POA: Diagnosis not present

## 2016-01-18 DIAGNOSIS — F329 Major depressive disorder, single episode, unspecified: Secondary | ICD-10-CM | POA: Diagnosis not present

## 2016-01-18 DIAGNOSIS — F411 Generalized anxiety disorder: Secondary | ICD-10-CM | POA: Diagnosis not present

## 2016-01-18 DIAGNOSIS — Z0000 Encounter for general adult medical examination without abnormal findings: Secondary | ICD-10-CM | POA: Diagnosis not present

## 2016-01-18 DIAGNOSIS — J309 Allergic rhinitis, unspecified: Secondary | ICD-10-CM | POA: Diagnosis not present

## 2016-01-20 DIAGNOSIS — Z0000 Encounter for general adult medical examination without abnormal findings: Secondary | ICD-10-CM | POA: Diagnosis not present

## 2016-04-12 MED FILL — LORYNA 3-0.02 MG TAB: 3-0.02 | 84 days supply | Qty: 112 | Fill #0

## 2016-09-23 DIAGNOSIS — H5213 Myopia, bilateral: Secondary | ICD-10-CM | POA: Diagnosis not present

## 2016-09-23 DIAGNOSIS — H52223 Regular astigmatism, bilateral: Secondary | ICD-10-CM | POA: Diagnosis not present

## 2016-10-19 ENCOUNTER — Encounter (HOSPITAL_BASED_OUTPATIENT_CLINIC_OR_DEPARTMENT_OTHER): Payer: Self-pay | Admitting: Emergency Medicine

## 2016-10-19 ENCOUNTER — Emergency Department (HOSPITAL_BASED_OUTPATIENT_CLINIC_OR_DEPARTMENT_OTHER)
Admission: EM | Admit: 2016-10-19 | Discharge: 2016-10-19 | Disposition: A | Discharge status: Home / Self Care | Payer: 59 | Attending: Emergency Medicine | Admitting: Emergency Medicine

## 2016-10-19 DIAGNOSIS — R509 Fever, unspecified: Secondary | ICD-10-CM | POA: Diagnosis not present

## 2016-10-19 DIAGNOSIS — R05 Cough: Secondary | ICD-10-CM | POA: Diagnosis not present

## 2016-10-19 DIAGNOSIS — J029 Acute pharyngitis, unspecified: Secondary | ICD-10-CM | POA: Insufficient documentation

## 2016-10-19 DIAGNOSIS — R51 Headache: Secondary | ICD-10-CM | POA: Insufficient documentation

## 2016-10-19 DIAGNOSIS — M791 Myalgia: Secondary | ICD-10-CM | POA: Insufficient documentation

## 2016-10-19 DIAGNOSIS — J111 Influenza due to unidentified influenza virus with other respiratory manifestations: Secondary | ICD-10-CM

## 2016-10-19 DIAGNOSIS — R0981 Nasal congestion: Secondary | ICD-10-CM | POA: Diagnosis not present

## 2016-10-19 DIAGNOSIS — R69 Illness, unspecified: Secondary | ICD-10-CM

## 2016-10-19 MED ORDER — OSELTAMIVIR PHOSPHATE 75 MG PO CAPS: 75.0000 mg | ORAL_CAPSULE | Freq: Two times a day (BID) | ORAL | 0 refills | Status: DC

## 2016-10-19 NOTE — Discharge Instructions (Addendum)
It was our pleasure to provide your ER care today - we hope that you feel better.  Rest. Drink plenty of fluids.  Take tamiflu as prescribed.   Take acetaminophen and/or ibuprofen as need for fever.   You may try over the counter cold and flu medication as need for symptom relief.  Follow up with primary care doctor in 1 week if symptoms fail to improve/resolve.  Return to ER if worse, new symptoms, trouble breathing, other concern.

## 2016-10-19 NOTE — ED Provider Notes (Signed)
[facility] Provider Note   CSN: [id number] Arrival date & time: 10/19/16  [id number]     History   Chief Complaint Chief Complaint  Patient presents with  . Cough  . Sore Throat  . Nausea    HPI Sydney Little is a 36 y.o. female.  Patient c/o fever, body aches, intermittent headache, congestion, scratchy throat, non prod cough, in past day. Symptoms acute in onset, persistent. No chest pain or sob. No trouble swallowing.  Family members w recent gi symptoms, and works in hospital, so +recent ill contact with flu-like illness. No abd pain or vomiting/diarrhea. No rash.    The history is provided by the patient.  Cough  Associated symptoms include headaches, sore throat and myalgias. Pertinent negatives include no chest pain and no shortness of breath.  Sore Throat  Associated symptoms include headaches. Pertinent negatives include no chest pain and no shortness of breath.    Past Medical History:  Diagnosis Date  . Acute blood loss anemia 12/17/2013  . Acute blood loss anemia 06/12/2015  . Fibroid    large 10cm  . Postpartum care following vaginal delivery (3/29) 12/15/2013  . SVD (spontaneous vaginal delivery) 12/17/2013  . Vaginal Pap smear, abnormal     Patient Active Problem List   Diagnosis Date Noted  . Acute blood loss anemia 06/12/2015  . Pregnancy 06/11/2015  . Uterine fibroid in pregnancy 06/11/2015  . Postpartum care following vaginal delivery (9/22) 06/11/2015  . SVD (spontaneous vaginal delivery) 06/11/2015    Past Surgical History:  Procedure Laterality Date  . DILATION AND CURETTAGE OF UTERUS    . WISDOM TOOTH EXTRACTION      OB History    Gravida Para Term Preterm AB Living   4 2 2   2 2    SAB TAB Ectopic Multiple Live Births     2   0 2       Home Medications    Prior to Admission medications   Medication Sig Start Date End Date Taking? Authorizing Provider  Docusate Sodium (COLACE PO) Take 2 tablets by mouth at bedtime as  needed.    Historical Provider, MD  Fe Cbn-Fe Gluc-FA-B12-C-DSS (FERRALET 90) 90-1 MG TABS Take 1 tablet by mouth daily. 12/17/13   Gustavo Lah, NP  ibuprofen (ADVIL,MOTRIN) 600 MG tablet Take 1 tablet (600 mg total) by mouth every 6 (six) hours. 06/12/15   Laury Deep, CNM  iron polysaccharides (NIFEREX) 150 MG capsule Take 1 capsule (150 mg total) by mouth daily. 06/12/15   Laury Deep, CNM  magnesium oxide (MAG-OX) 400 (241.3 MG) MG tablet Take 0.5 tablets (200 mg total) by mouth daily. 06/12/15   Laury Deep, CNM  Prenatal Vit-Fe Fumarate-FA (MULTIVITAMIN-PRENATAL) 27-0.8 MG TABS tablet Take 1 tablet by mouth daily at 12 noon.    Historical Provider, MD    Family History Family History  Problem Relation Age of Onset  . Hypertension Father   . Cancer Mother     breast  . Cancer Maternal Grandfather     pancreatic  . Cancer Paternal Grandmother     lymphoma  . Cancer Paternal Grandfather     prostate    Social History Social History  Substance Use Topics  . Smoking status: Never Smoker  . Smokeless tobacco: Never Used  . Alcohol use Yes     Allergies   Patient has no known allergies.   Review of Systems Review of Systems  Constitutional: Positive for fever.  HENT: Positive for  congestion and sore throat.   Respiratory: Positive for cough. Negative for shortness of breath.   Cardiovascular: Negative for chest pain.  Gastrointestinal: Negative for diarrhea and vomiting.  Musculoskeletal: Positive for myalgias.  Neurological: Positive for headaches.     Physical Exam Updated Vital Signs BP 106/73 (BP Location: Right Arm)   Pulse 74   Temp 98.4 F (36.9 C) (Oral)   Resp 18   Ht 5\' 4"  (1.626 m)   Wt 53.5 kg   SpO2 97%   BMI 20.25 kg/m   Physical Exam  Constitutional: She appears well-developed and well-nourished. No distress.  HENT:  Right Ear: External ear normal.  Left Ear: External ear normal.  Mouth/Throat: Oropharynx is clear and moist.  Mild  nasal congestion.   Eyes: Conjunctivae are normal. No scleral icterus.  Neck: Neck supple. No tracheal deviation present.  No rigidity  Cardiovascular: Normal rate, regular rhythm, normal heart sounds and intact distal pulses.   Pulmonary/Chest: Effort normal and breath sounds normal. No respiratory distress.  Abdominal: Normal appearance.  Musculoskeletal: She exhibits no edema.  Neurological: She is alert.  Skin: Skin is warm and dry. No rash noted. She is not diaphoretic.  Psychiatric: She has a normal mood and affect.  Nursing note and vitals reviewed.    ED Treatments / Results  Labs (all labs ordered are listed, but only abnormal results are displayed) Labs Reviewed - No data to display  EKG  EKG Interpretation None       Radiology No results found.  Procedures Procedures (including critical care time)  Medications Ordered in ED Medications - No data to display   Initial Impression / Assessment and Plan / ED Course  I have reviewed the triage vital signs and the nursing notes.  Pertinent labs & imaging results that were available during my care of the patient were reviewed by me and considered in my medical decision making (see chart for details).  Patient symptoms and exam felt most c/w flu-like illness.  Will give rx tamiflu.  Patient appears stable for d/c.    Final Clinical Impressions(s) / ED Diagnoses   Final diagnoses:  None    New Prescriptions New Prescriptions   No medications on file     Lajean Saver, MD 10/19/16 [phone]

## 2016-10-19 NOTE — ED Triage Notes (Signed)
Onset yesterday of nausea  Cough fever  Body aches

## 2017-02-08 DIAGNOSIS — L700 Acne vulgaris: Secondary | ICD-10-CM | POA: Diagnosis not present

## 2017-02-20 DIAGNOSIS — O209 Hemorrhage in early pregnancy, unspecified: Secondary | ICD-10-CM | POA: Diagnosis not present

## 2017-02-20 DIAGNOSIS — Z3200 Encounter for pregnancy test, result unknown: Secondary | ICD-10-CM | POA: Diagnosis not present

## 2017-02-20 DIAGNOSIS — Z3A01 Less than 8 weeks gestation of pregnancy: Secondary | ICD-10-CM | POA: Diagnosis not present

## 2017-02-22 DIAGNOSIS — O209 Hemorrhage in early pregnancy, unspecified: Secondary | ICD-10-CM | POA: Diagnosis not present

## 2017-02-22 DIAGNOSIS — Z3A01 Less than 8 weeks gestation of pregnancy: Secondary | ICD-10-CM | POA: Diagnosis not present

## 2017-03-27 DIAGNOSIS — Z3200 Encounter for pregnancy test, result unknown: Secondary | ICD-10-CM | POA: Diagnosis not present

## 2017-03-27 DIAGNOSIS — O0901 Supervision of pregnancy with history of infertility, first trimester: Secondary | ICD-10-CM | POA: Diagnosis not present

## 2017-03-27 DIAGNOSIS — Z3A01 Less than 8 weeks gestation of pregnancy: Secondary | ICD-10-CM | POA: Diagnosis not present

## 2017-03-29 DIAGNOSIS — Z3A01 Less than 8 weeks gestation of pregnancy: Secondary | ICD-10-CM | POA: Diagnosis not present

## 2017-03-29 DIAGNOSIS — O0901 Supervision of pregnancy with history of infertility, first trimester: Secondary | ICD-10-CM | POA: Diagnosis not present

## 2017-04-12 DIAGNOSIS — Z3201 Encounter for pregnancy test, result positive: Secondary | ICD-10-CM | POA: Diagnosis not present

## 2017-05-02 DIAGNOSIS — Z3689 Encounter for other specified antenatal screening: Secondary | ICD-10-CM | POA: Diagnosis not present

## 2017-05-02 DIAGNOSIS — Z118 Encounter for screening for other infectious and parasitic diseases: Secondary | ICD-10-CM | POA: Diagnosis not present

## 2017-05-02 DIAGNOSIS — O0901 Supervision of pregnancy with history of infertility, first trimester: Secondary | ICD-10-CM | POA: Diagnosis not present

## 2017-05-02 LAB — OB RESULTS CONSOLE GC/CHLAMYDIA
Chlamydia: NEGATIVE
Gonorrhea: NEGATIVE

## 2017-05-08 DIAGNOSIS — O0901 Supervision of pregnancy with history of infertility, first trimester: Secondary | ICD-10-CM | POA: Diagnosis not present

## 2017-05-08 DIAGNOSIS — Z3A10 10 weeks gestation of pregnancy: Secondary | ICD-10-CM | POA: Diagnosis not present

## 2017-05-08 DIAGNOSIS — O09529 Supervision of elderly multigravida, unspecified trimester: Secondary | ICD-10-CM | POA: Diagnosis not present

## 2017-05-08 LAB — OB RESULTS CONSOLE HEPATITIS B SURFACE ANTIGEN: Hepatitis B Surface Ag: NEGATIVE

## 2017-05-08 LAB — OB RESULTS CONSOLE HIV ANTIBODY (ROUTINE TESTING): HIV: NONREACTIVE

## 2017-05-08 LAB — OB RESULTS CONSOLE ABO/RH: RH Type: POSITIVE

## 2017-05-08 LAB — OB RESULTS CONSOLE RPR: RPR: NONREACTIVE

## 2017-05-08 LAB — OB RESULTS CONSOLE ANTIBODY SCREEN: Antibody Screen: NEGATIVE

## 2017-05-08 LAB — OB RESULTS CONSOLE RUBELLA ANTIBODY, IGM: Rubella: IMMUNE

## 2017-05-17 DIAGNOSIS — Z3682 Encounter for antenatal screening for nuchal translucency: Secondary | ICD-10-CM | POA: Diagnosis not present

## 2017-06-13 DIAGNOSIS — Z361 Encounter for antenatal screening for raised alphafetoprotein level: Secondary | ICD-10-CM | POA: Diagnosis not present

## 2017-07-05 DIAGNOSIS — O3412 Maternal care for benign tumor of corpus uteri, second trimester: Secondary | ICD-10-CM | POA: Diagnosis not present

## 2017-07-05 DIAGNOSIS — Z3A19 19 weeks gestation of pregnancy: Secondary | ICD-10-CM | POA: Diagnosis not present

## 2017-07-05 DIAGNOSIS — Z363 Encounter for antenatal screening for malformations: Secondary | ICD-10-CM | POA: Diagnosis not present

## 2017-08-30 DIAGNOSIS — Z3480 Encounter for supervision of other normal pregnancy, unspecified trimester: Secondary | ICD-10-CM | POA: Diagnosis not present

## 2017-08-30 DIAGNOSIS — Z3689 Encounter for other specified antenatal screening: Secondary | ICD-10-CM | POA: Diagnosis not present

## 2017-08-30 DIAGNOSIS — Z23 Encounter for immunization: Secondary | ICD-10-CM | POA: Diagnosis not present

## 2017-09-19 NOTE — L&D Delivery Note (Signed)
Delivery Note arom with clear fluid and c/c/+2; pt then began pushing  At 4:48 AM a viable and healthy female was delivered via svd over intact perineum;  Presentation: ROA;  APGAR: 8, 9; weight 7 lb 6.9 oz (3371 g).   Placenta status: delivered spontaneously and intact.  Cord: 3vv,  with the following complications:none   Anesthesia: epidural Episiotomy: None Lacerations: None Suture Repair: n/a Est. Blood Loss (mL): 200  Mom to postpartum.  Baby to Couplet care / Skin to Skin.    Charyl Bigger 12/05/2017, 11:21 AM (late entry, previous note not saved)

## 2017-11-21 ENCOUNTER — Other Ambulatory Visit: Payer: Self-pay

## 2017-11-21 ENCOUNTER — Inpatient Hospital Stay (HOSPITAL_COMMUNITY)
Admission: AD | Admit: 2017-11-21 | Discharge: 2017-11-23 | DRG: 807 | Disposition: A | Discharge status: Home / Self Care | Payer: No Typology Code available for payment source | Source: Ambulatory Visit | Attending: Obstetrics and Gynecology | Admitting: Obstetrics and Gynecology

## 2017-11-21 ENCOUNTER — Encounter (HOSPITAL_COMMUNITY): Payer: Self-pay

## 2017-11-21 DIAGNOSIS — Z3A39 39 weeks gestation of pregnancy: Secondary | ICD-10-CM

## 2017-11-21 DIAGNOSIS — O9902 Anemia complicating childbirth: Secondary | ICD-10-CM | POA: Diagnosis present

## 2017-11-21 DIAGNOSIS — O3413 Maternal care for benign tumor of corpus uteri, third trimester: Secondary | ICD-10-CM | POA: Diagnosis present

## 2017-11-21 DIAGNOSIS — D509 Iron deficiency anemia, unspecified: Secondary | ICD-10-CM | POA: Diagnosis present

## 2017-11-21 DIAGNOSIS — Z3483 Encounter for supervision of other normal pregnancy, third trimester: Secondary | ICD-10-CM | POA: Diagnosis present

## 2017-11-21 DIAGNOSIS — D259 Leiomyoma of uterus, unspecified: Secondary | ICD-10-CM | POA: Diagnosis present

## 2017-11-21 DIAGNOSIS — D649 Anemia, unspecified: Secondary | ICD-10-CM

## 2017-11-21 MED ORDER — LACTATED RINGERS IV SOLN: 500.0000 mL | INTRAVENOUS | Status: DC | PRN

## 2017-11-21 MED ORDER — FLEET ENEMA 7-19 GM/118ML RE ENEM: 1.0000 | ENEMA | RECTAL | Status: DC | PRN

## 2017-11-21 MED ORDER — ONDANSETRON HCL 4 MG/2ML IJ SOLN: 4.0000 mg | Freq: Four times a day (QID) | INTRAMUSCULAR | Status: DC | PRN

## 2017-11-21 MED ORDER — OXYTOCIN 40 UNITS IN LACTATED RINGERS INFUSION - SIMPLE MED
2.5000 [IU]/h | INTRAVENOUS | Status: DC
  Administered 2017-11-22: 2.5 [IU]/h via INTRAVENOUS
  Filled 2017-11-21: qty 1000

## 2017-11-21 MED ORDER — SOD CITRATE-CITRIC ACID 500-334 MG/5ML PO SOLN: 30.0000 mL | ORAL | Status: DC | PRN

## 2017-11-21 MED ORDER — OXYCODONE-ACETAMINOPHEN 5-325 MG PO TABS: 1.0000 | ORAL_TABLET | ORAL | Status: DC | PRN

## 2017-11-21 MED ORDER — ACETAMINOPHEN 325 MG PO TABS: 650.0000 mg | ORAL_TABLET | ORAL | Status: DC | PRN

## 2017-11-21 MED ORDER — LIDOCAINE HCL (PF) 1 % IJ SOLN
30.0000 mL | INTRAMUSCULAR | Status: DC | PRN
  Filled 2017-11-21: qty 30

## 2017-11-21 MED ORDER — OXYTOCIN BOLUS FROM INFUSION
500.0000 mL | Freq: Once | INTRAVENOUS | Status: AC
  Administered 2017-11-22: 500 mL via INTRAVENOUS

## 2017-11-21 MED ORDER — OXYCODONE-ACETAMINOPHEN 5-325 MG PO TABS: 2.0000 | ORAL_TABLET | ORAL | Status: DC | PRN

## 2017-11-21 MED ORDER — LACTATED RINGERS IV SOLN
INTRAVENOUS | Status: DC
  Administered 2017-11-21 – 2017-11-22 (×2): via INTRAVENOUS

## 2017-11-21 MED ORDER — LACTATED RINGERS IV BOLUS (SEPSIS)
1000.0000 mL | Freq: Once | INTRAVENOUS | Status: AC
  Administered 2017-11-21: 1000 mL via INTRAVENOUS

## 2017-11-21 NOTE — Progress Notes (Addendum)
G5P2 @ [redacted] wksga. Here dt ctx all day and progressively worse to q 7 minutes. Denies LOF or bleeding. EFM applied. VSS. See flow sheet for details.   SVE 4/40/-3 vertex   2140: MD in unit. Report status of pt given. Orders received to have pt walk for and hr and recheck cervix.   2145: pt up walking. Instructed to rturn to room at 2245 for cervical exam recheck.   2250: back from walking. Pt stating pain increased. SVE 4.5/70/-3  Provider notified. Report status of pt given. Orders received to admit pt.  IV and labs done  2322: Pt to birthing via wheelchair

## 2017-11-22 ENCOUNTER — Inpatient Hospital Stay (HOSPITAL_COMMUNITY): Payer: No Typology Code available for payment source | Admitting: Anesthesiology

## 2017-11-22 ENCOUNTER — Encounter (HOSPITAL_COMMUNITY): Payer: Self-pay

## 2017-11-22 LAB — CBC
HCT: 31.1 % — ABNORMAL LOW (ref 36.0–46.0)
Hemoglobin: 10.1 g/dL — ABNORMAL LOW (ref 12.0–15.0)
MCH: 25.6 pg — ABNORMAL LOW (ref 26.0–34.0)
MCHC: 32.5 g/dL (ref 30.0–36.0)
MCV: 78.9 fL (ref 78.0–100.0)
Platelets: 189 10*3/uL (ref 150–400)
RBC: 3.94 MIL/uL (ref 3.87–5.11)
RDW: 14.8 % (ref 11.5–15.5)
WBC: 10.2 10*3/uL (ref 4.0–10.5)

## 2017-11-22 LAB — TYPE AND SCREEN
ABO/RH(D): B POS
Antibody Screen: NEGATIVE

## 2017-11-22 LAB — RPR: RPR Ser Ql: NONREACTIVE

## 2017-11-22 MED ORDER — WITCH HAZEL-GLYCERIN EX PADS: 1.0000 "application " | MEDICATED_PAD | CUTANEOUS | Status: DC | PRN

## 2017-11-22 MED ORDER — BENZOCAINE-MENTHOL 20-0.5 % EX AERO: 1.0000 "application " | INHALATION_SPRAY | CUTANEOUS | Status: DC | PRN

## 2017-11-22 MED ORDER — EPHEDRINE 5 MG/ML INJ
10.0000 mg | INTRAVENOUS | Status: DC | PRN
  Filled 2017-11-22: qty 2

## 2017-11-22 MED ORDER — SENNOSIDES-DOCUSATE SODIUM 8.6-50 MG PO TABS
2.0000 | ORAL_TABLET | ORAL | Status: DC
  Filled 2017-11-22: qty 2

## 2017-11-22 MED ORDER — ACETAMINOPHEN 325 MG PO TABS
650.0000 mg | ORAL_TABLET | ORAL | Status: DC | PRN
  Administered 2017-11-22 – 2017-11-23 (×3): 650 mg via ORAL
  Filled 2017-11-22 (×3): qty 2

## 2017-11-22 MED ORDER — PHENYLEPHRINE 40 MCG/ML (10ML) SYRINGE FOR IV PUSH (FOR BLOOD PRESSURE SUPPORT)
80.0000 ug | PREFILLED_SYRINGE | INTRAVENOUS | Status: DC | PRN
  Filled 2017-11-22: qty 5

## 2017-11-22 MED ORDER — PHENYLEPHRINE 40 MCG/ML (10ML) SYRINGE FOR IV PUSH (FOR BLOOD PRESSURE SUPPORT)
80.0000 ug | PREFILLED_SYRINGE | INTRAVENOUS | Status: DC | PRN
  Filled 2017-11-22: qty 10
  Filled 2017-11-22: qty 5

## 2017-11-22 MED ORDER — ONDANSETRON HCL 4 MG/2ML IJ SOLN: 4.0000 mg | INTRAMUSCULAR | Status: DC | PRN

## 2017-11-22 MED ORDER — ONDANSETRON HCL 4 MG PO TABS: 4.0000 mg | ORAL_TABLET | ORAL | Status: DC | PRN

## 2017-11-22 MED ORDER — LACTATED RINGERS IV SOLN: 500.0000 mL | Freq: Once | INTRAVENOUS | Status: DC

## 2017-11-22 MED ORDER — PRENATAL MULTIVITAMIN CH
1.0000 | ORAL_TABLET | Freq: Every day | ORAL | Status: DC
  Administered 2017-11-22: 1 via ORAL
  Filled 2017-11-22: qty 1

## 2017-11-22 MED ORDER — IBUPROFEN 600 MG PO TABS
600.0000 mg | ORAL_TABLET | Freq: Four times a day (QID) | ORAL | Status: DC
  Administered 2017-11-22 – 2017-11-23 (×5): 600 mg via ORAL
  Filled 2017-11-22 (×5): qty 1

## 2017-11-22 MED ORDER — DIPHENHYDRAMINE HCL 50 MG/ML IJ SOLN: 12.5000 mg | INTRAMUSCULAR | Status: DC | PRN

## 2017-11-22 MED ORDER — LACTATED RINGERS IV SOLN
500.0000 mL | Freq: Once | INTRAVENOUS | Status: AC
  Administered 2017-11-22: 500 mL via INTRAVENOUS

## 2017-11-22 MED ORDER — DIPHENHYDRAMINE HCL 25 MG PO CAPS: 25.0000 mg | ORAL_CAPSULE | Freq: Four times a day (QID) | ORAL | Status: DC | PRN

## 2017-11-22 MED ORDER — FENTANYL 2.5 MCG/ML BUPIVACAINE 1/10 % EPIDURAL INFUSION (WH - ANES)
14.0000 mL/h | INTRAMUSCULAR | Status: DC | PRN
  Administered 2017-11-22: 14 mL/h via EPIDURAL
  Filled 2017-11-22: qty 100

## 2017-11-22 MED ORDER — DIBUCAINE 1 % RE OINT: 1.0000 "application " | TOPICAL_OINTMENT | RECTAL | Status: DC | PRN

## 2017-11-22 MED ORDER — COCONUT OIL OIL: 1.0000 "application " | TOPICAL_OIL | Status: DC | PRN

## 2017-11-22 MED ORDER — TETANUS-DIPHTH-ACELL PERTUSSIS 5-2.5-18.5 LF-MCG/0.5 IM SUSP: 0.5000 mL | Freq: Once | INTRAMUSCULAR | Status: DC

## 2017-11-22 MED ORDER — LIDOCAINE HCL (PF) 1 % IJ SOLN
INTRAMUSCULAR | Status: DC | PRN
  Administered 2017-11-22: 6 mL via EPIDURAL

## 2017-11-22 MED ORDER — SIMETHICONE 80 MG PO CHEW: 80.0000 mg | CHEWABLE_TABLET | ORAL | Status: DC | PRN

## 2017-11-22 NOTE — Anesthesia Procedure Notes (Signed)
Epidural Patient location during procedure: OB Start time: 11/22/2017 1:13 AM End time: 11/22/2017 1:20 AM  Staffing Anesthesiologist: Barnet Glasgow, MD Performed: anesthesiologist   Preanesthetic Checklist Completed: patient identified, site marked, surgical consent, pre-op evaluation, timeout performed, IV checked, risks and benefits discussed and monitors and equipment checked  Epidural Patient position: sitting Prep: site prepped and draped and DuraPrep Patient monitoring: continuous pulse ox and blood pressure Approach: midline Location: L3-L4 Injection technique: LOR air  Needle:  Needle type: Tuohy  Needle gauge: 17 G Needle length: 9 cm and 9 Needle insertion depth: 5 cm cm Catheter type: closed end flexible Catheter size: 19 Gauge Catheter at skin depth: 10 cm Test dose: negative  Assessment Events: blood not aspirated, injection not painful, no injection resistance, negative IV test and no paresthesia

## 2017-11-22 NOTE — Lactation Note (Signed)
This note was copied from a baby's chart. Lactation Consultation Note  Patient Name: Sydney Little [id number] Date: 11/22/2017 Reason for consult: Initial assessment;Term  Baby is 5 hours old  LC reviewed and updated the doc flow sheets.  Baby waking up  as the [facility] entered the room  And LC used the bulb syringe x 1 due to clear secretions.  Dad changed a wet and a mec  Stool diaper.  Baby latched with assistance , swallows noted.  Baby fed for 17 mins , and nipple well rounded when baby released.  Latch score 9  Mother informed of post-discharge support and given phone number to the lactation department, including services for phone call assistance; out-patient appointments; and breastfeeding support group. List of other breastfeeding resources in the community given in the handout. Encouraged mother to call for problems or concerns related to breastfeeding.   Maternal Data Has patient been taught Hand Expression?: Yes Does the patient have breastfeeding experience prior to this delivery?: Yes  Feeding Feeding Type: Breast Fed Length of feed: 17 min(swallows noted )  LATCH Score Latch: Grasps breast easily, tongue down, lips flanged, rhythmical sucking.  Audible Swallowing: Spontaneous and intermittent  Type of Nipple: Everted at rest and after stimulation  Comfort (Breast/Nipple): Soft / non-tender  Hold (Positioning): Assistance needed to correctly position infant at breast and maintain latch.  LATCH Score: 9  Interventions Interventions: Breast feeding basics reviewed;Assisted with latch;Skin to skin;Breast massage;Hand express;Breast compression;Adjust position;Support pillows;Position options;Shells  Lactation Tools Discussed/Used Tools: Shells WIC Program: No   Consult Status Consult Status: Follow-up Date: 11/23/17 Follow-up type: In-patient    [place] 11/22/2017, 3:19 PM

## 2017-11-22 NOTE — Anesthesia Preprocedure Evaluation (Addendum)
Anesthesia Evaluation  Patient identified by MRN, date of birth, ID band Patient awake    Reviewed: Allergy & Precautions, NPO status , Patient's Chart, lab work & pertinent test results  Airway Mallampati: II  TM Distance: >3 FB Neck ROM: Full    Dental no notable dental hx.    Pulmonary neg pulmonary ROS,    Pulmonary exam normal breath sounds clear to auscultation       Cardiovascular negative cardio ROS Normal cardiovascular exam Rhythm:Regular Rate:Normal     Neuro/Psych    GI/Hepatic   Endo/Other    Renal/GU      Musculoskeletal   Abdominal   Peds  Hematology   Anesthesia Other Findings   Reproductive/Obstetrics negative OB ROS (+) Pregnancy                             Lab Results  Component Value Date   WBC 11.2 (H) 06/12/2015   HGB 9.8 (L) 06/12/2015   HCT 30.6 (L) 06/12/2015   MCV 82.9 06/12/2015   PLT 119 (L) 06/12/2015    Anesthesia Physical Anesthesia Plan  ASA: II  Anesthesia Plan: Epidural   Post-op Pain Management:    Induction:   PONV Risk Score and Plan:   Airway Management Planned:   Additional Equipment:   Intra-op Plan:   Post-operative Plan:   Informed Consent: I have reviewed the patients History and Physical, chart, labs and discussed the procedure including the risks, benefits and alternatives for the proposed anesthesia with the patient or authorized representative who has indicated his/her understanding and acceptance.     Plan Discussed with:   Anesthesia Plan Comments:         Anesthesia Quick Evaluation

## 2017-11-22 NOTE — Anesthesia Postprocedure Evaluation (Signed)
Anesthesia Post Note  Patient: Sydney Little  Procedure(s) Performed: AN AD [place]     Patient location during evaluation: Mother Baby Anesthesia Type: Epidural Level of consciousness: awake, awake and alert and oriented Pain management: pain level controlled Vital Signs Assessment: post-procedure vital signs reviewed and stable Respiratory status: spontaneous breathing, nonlabored ventilation and respiratory function stable Cardiovascular status: stable Postop Assessment: no backache, patient able to bend at knees, no apparent nausea or vomiting, adequate PO intake and no headache Anesthetic complications: no    Last Vitals:  Vitals:   11/22/17 0636 11/22/17 0800  BP: 112/64 (!) 99/53  Pulse: 70 63  Resp: 16 12  Temp:  36.8 C  SpO2:      Last Pain:  Vitals:   11/22/17 0800  TempSrc: Oral  PainSc: 0-No pain   Pain Goal: Patients Stated Pain Goal: 4 (11/21/17 2102)               Elan Brainerd

## 2017-11-22 NOTE — H&P (Signed)
Sydney Little is a 36 y.o. [id number] at [redacted]w[redacted]d gestation presents for complaint of Contractions that are painful She denies any lof/vb but notes +FM. Antepartum course: uncomplicated; last growth u/s 32 wga and efw 74%, ac 52% [facility] 10 wks.  See complete pre-natal records  History OB History    Gravida Para Term Preterm AB Living   5 2 2   2 2    SAB TAB Ectopic Multiple Live Births     2   0 2     Past Medical History:  Diagnosis Date  . Acute blood loss anemia 12/17/2013  . Acute blood loss anemia 06/12/2015  . Fibroid    large 10cm  . Postpartum care following vaginal delivery (3/29) 12/15/2013  . SVD (spontaneous vaginal delivery) 12/17/2013  . Vaginal Pap smear, abnormal    Past Surgical History:  Procedure Laterality Date  . DILATION AND CURETTAGE OF UTERUS     2001/2004  . WISDOM TOOTH EXTRACTION     Family History: family history includes Cancer in her maternal grandfather, mother, paternal grandfather, and paternal grandmother; Hypertension in her father. Social History:  reports that  has never smoked. she has never used smokeless tobacco. She reports that she drinks alcohol. She reports that she does not use drugs.  ROS: See above otherwise negative  Prenatal labs:  ABO, Rh: B/Positive/-- (08/20 0000) Antibody: Negative (08/20 0000) Rubella: Immune (08/20 0000) RPR: Nonreactive (08/20 0000)  HBsAg: Negative (08/20 0000)  HIV:Non-reactive (08/20 0000)  GBS:   negative 1 hr Glucola: Normal Genetic screening: Normal Anatomy US: Normal  Physical Exam:   Dilation: 4.5 Effacement (%): 70 Station: -3 Exam by:: Chinita Greenland, RN  Blood pressure 123/75, pulse 82, temperature 97.7 F (36.5 C), temperature source Oral, resp. rate 18, height 5\' 4"  (1.626 m), weight 162 lb (73.5 kg), unknown if currently breastfeeding. A&O x 3 HEENT: Normal Lungs: CTAB CV: RRR Abdominal: Soft, Non-tender and Gravid; efw  Lower Extremities: Non-edematous,  Non-tender  Pelvic Exam:      Dilatation: 8cm     Effacement: 80%     Station: -1     Presentation: Cephalic  Labs:  CBC:  Lab Results  Component Value Date   WBC 10.2 11/21/2017   RBC 3.94 11/21/2017   HGB 10.1 (L) 11/21/2017   HCT 31.1 (L) 11/21/2017   MCV 78.9 11/21/2017   MCH 25.6 (L) 11/21/2017   MCHC 32.5 11/21/2017   RDW 14.8 11/21/2017   PLT 189 11/21/2017   CMP: No results found for: NA, K, CL, CO2, GLUCOSE, BUN, CREATININE, CALCIUM, PROT, AST, ALT, ALBUMIN, ALKPHOS, BILITOT, GFRNONAA, GFRAA, ANIONGAP Urine: Lab Results  Component Value Date   BILIRUBINUR negative 05/13/2013   KETONESUR negative 05/13/2013   PROTEINUR negative 05/13/2013   NITRITE Negative 05/13/2013   LEUKOCYTESUR Negative 05/13/2013     Prenatal Transfer Tool  Maternal Diabetes: No Genetic Screening: Normal Maternal Ultrasounds/Referrals: Normal Fetal Ultrasounds or other Referrals:  None Maternal Substance Abuse:  No Significant Maternal Medications:  None Significant Maternal Lab Results: None  FHT: 120s, nml variability, +accels, no decels Toco: q 2  Assessment/Plan:  37 y.o. [id number] at [redacted]w[redacted]d gestation   1. Active labor - admit and anticipate svd; pt is now s/p epidural and getting comfortable, would like to hold arom until more comfortable 2. 2. Fetal status reassuring 3. 3. gbs neg 4. 4. Rh pos   Charyl Bigger 11/22/2017, 1:07 AM

## 2017-11-22 NOTE — Progress Notes (Signed)
No c/o; minimal lochia, cramping but meds help Nursing  Patient Vitals for the past 24 hrs:  BP Temp Temp src Pulse Resp SpO2 Height Weight  11/22/17 1200 106/60 98.4 F (36.9 C) Oral (!) 53 16 99 % - -  11/22/17 0800 (!) 99/53 98.3 F (36.8 C) Oral 63 12 - - -  11/22/17 0636 112/64 - - 70 16 - - -  11/22/17 0604 108/67 - - 72 18 - - -  11/22/17 0546 127/71 - - 91 18 - - -  11/22/17 0531 120/65 - - 75 18 - - -  11/22/17 0516 127/71 - - 90 18 - - -  11/22/17 0501 122/74 - - 90 18 - - -  11/22/17 0458 92/73 - - (!) 102 18 - - -  11/22/17 0431 120/73 - - 81 18 - - -  11/22/17 0401 118/64 - - 81 18 - - -  11/22/17 0331 115/66 - - 70 18 - - -  11/22/17 0301 117/74 - - 83 18 - - -  11/22/17 0231 112/71 - - 76 18 - - -  11/22/17 0230 - - - - - 99 % - -  11/22/17 0225 - - - - - 99 % - -  11/22/17 0220 - - - - - 98 % - -  11/22/17 0215 - - - - - 99 % - -  11/22/17 0210 - - - - - 99 % - -  11/22/17 0205 - - - - - 99 % - -  11/22/17 0200 111/71 - - 74 - 99 % - -  11/22/17 0156 122/71 - - 77 - - - -  11/22/17 0155 - - - - - 99 % - -  11/22/17 0151 (!) 114/58 - - 77 - - - -  11/22/17 0150 - - - - - 99 % - -  11/22/17 0146 113/74 - - 78 - - - -  11/22/17 0145 - - - - - 99 % - -  11/22/17 0141 130/72 - - 80 18 100 % - -  11/22/17 0135 110/85 - - 81 18 100 % - -  11/22/17 0131 122/80 - - 79 18 - - -  11/22/17 0126 120/70 - - 73 18 - - -  11/22/17 0123 113/68 - - 82 18 - - -  11/22/17 0119 126/71 - - 92 18 - - -  11/22/17 0104 123/75 - - 82 18 - - -  11/22/17 0038 123/76 - - 96 18 - - -  11/22/17 0015 119/77 - - 84 18 - - -  11/21/17 2336 120/71 97.7 F (36.5 C) Oral 68 18 - - -  11/21/17 2118 - 98.5 F (36.9 C) Oral - 18 - 5\' 4"  (1.626 m) 162 lb (73.5 kg)   A&ox3 rrr ctab Abd: fundus firm and at umb; soft, nt,nd LE: no edema, nt bilat  CBC Latest Ref Rng & Units 11/21/2017 06/12/2015 06/11/2015  WBC 4.0 - 10.5 K/uL 10.2 11.2(H) 8.6  Hemoglobin 12.0 - 15.0 g/dL 10.1(L) 9.8(L)  11.5(L)  Hematocrit 36.0 - 46.0 % 31.1(L) 30.6(L) 35.6(L)  Platelets 150 - 400 K/uL 189 119(L) 137(L)   A/p: ppd 0 s/p svd 1. Doing well, contin care 2. Rh pos 3. nursing

## 2017-11-23 DIAGNOSIS — D649 Anemia, unspecified: Secondary | ICD-10-CM

## 2017-11-23 LAB — CBC
HCT: 29.1 % — ABNORMAL LOW (ref 36.0–46.0)
Hemoglobin: 9.5 g/dL — ABNORMAL LOW (ref 12.0–15.0)
MCH: 25.5 pg — ABNORMAL LOW (ref 26.0–34.0)
MCHC: 32.6 g/dL (ref 30.0–36.0)
MCV: 78 fL (ref 78.0–100.0)
Platelets: 164 10*3/uL (ref 150–400)
RBC: 3.73 MIL/uL — ABNORMAL LOW (ref 3.87–5.11)
RDW: 15 % (ref 11.5–15.5)
WBC: 13 10*3/uL — ABNORMAL HIGH (ref 4.0–10.5)

## 2017-11-23 MED ORDER — IBUPROFEN 600 MG PO TABS: 600.0000 mg | ORAL_TABLET | Freq: Four times a day (QID) | ORAL | 0 refills | Status: AC

## 2017-11-23 NOTE — Progress Notes (Signed)
PPD #1, SVD, intact perineum, baby boy   S:  Reports feeling well and ready to be discharged home today .  States she has some tailbone pain, but reports she always has this after delivery.              Tolerating po/ No nausea or vomiting / Denies dizziness or SOB             Bleeding is moderate, few small clots noted             Pain controlled with Motrin             Up ad lib / ambulatory / voiding QS  Newborn breast feeding, going well per mom  / Circumcision - completed yesterday   O:               VS: BP (!) 113/57 (BP Location: Left Arm)   Pulse 79   Temp 97.7 F (36.5 C) (Axillary)   Resp 18   Ht 5\' 4"  (1.626 m)   Wt 73.5 kg (162 lb)   SpO2 99%   Breastfeeding? Unknown   BMI 27.81 kg/m    LABS:              Recent Labs    11/21/17 2305 11/23/17 0518  WBC 10.2 13.0*  HGB 10.1* 9.5*  PLT 189 164               Blood type: --/--/B POS (03/05 2305)  Rubella: Immune (08/20 0000)                     I&O: Intake/Output      03/06 0701 - 03/07 0700 03/07 0701 - 03/08 0700   Urine (mL/kg/hr)     Blood     Total Output     Net          Urine Occurrence 1 x                  Physical Exam:             Alert and oriented X3  Lungs: Clear and unlabored  Heart: regular rate and rhythm / no murmurs  Abdomen: soft, non-tender, non-distended              Fundus: firm, non-tender, U-1  Perineum: intact, no significant edema or erythema   Lochia: small, no clots   Extremities: trace pedal edema, no calf pain or tenderness    A: PPD # 1, SVD  Acute on chronic anemia - stable, asymptomatic    Doing well - stable status  P: Routine post partum orders  Prefers not to take oral iron due to constipation; recommend continuing prenatal vitamin and increasing iron-rich foods.  Discussed warning s/s and to begin stool softener if she begins OTC iron supplement   Recommend heating pad to coccyx; Motrin PRN  Recommend postpartum abdominal binder   Discharge home today  WOB  discharge book and instructions reviewed   F/u in 6 weeks with Dr. Benjie Karvonen for [facility] visit   Lars Pinks, MSN, CNM [facility]

## 2017-11-23 NOTE — Lactation Note (Signed)
This note was copied from a baby's chart. Lactation Consultation Note  Patient Name: Sydney Little [id number] Date: 11/23/2017 Reason for consult: Follow-up assessment;Term;Other (Comment)(3 rd baby - exp BF )  As LC entered the room , just finishing feeding 10 mins.  LC updated the doc flow sheets and reviewed  Mom denies soreness, has not used the shells provided to her yesterday , and per mom  The pinching with latch has improved. MBURN provided mom with hand pump.  LC reviewed sore  Nipple and engorgement prevention and tx.  Also provided mom with her DEBP insurance - [employer].  Mother informed of post-discharge support and given phone number to the lactation department, including services for phone call assistance; out-patient appointments; and breastfeeding support group. List of other breastfeeding resources in the community given in the handout. Encouraged mother to call for problems or concerns related to breastfeeding.   Maternal Data    Feeding Feeding Type: Breast Fed(per mom the baby has been cluster feeding since 2 am ) Length of feed: 10 min(per mom )  LATCH Score                   Interventions Interventions: Breast feeding basics reviewed  Lactation Tools Discussed/Used Tools: Pump;Shells(hand pump provided by the RN ) Shell Type: Inverted Breast pump type: Manual Initiated by:: js Date initiated:: 11/23/17   Consult Status Consult Status: Complete Date: 11/23/17 Follow-up type: In-patient    [place] 11/23/2017, 9:36 AM

## 2017-11-23 NOTE — Discharge Summary (Signed)
Obstetric Discharge Summary   Patient Name: Sydney Little DOB: [birth year] MRN: [id number]  Date of Admission: 11/21/2017 Date of Discharge: 11/23/2017 Date of Delivery: 11/22/2017 Gestational Age at Delivery: [redacted]w[redacted]d  Primary OB: [facility] - Dr. Benjie Karvonen   Antepartum complications:  - AMA - Uterine fibroid - Maternal Iron deficiency anemia  Prenatal Labs:  ABO, Rh: B/Positive/-- (08/20 0000) Antibody: Negative (08/20 0000) Rubella: Immune (08/20 0000) RPR: Nonreactive (08/20 0000)  HBsAg: Negative (08/20 0000)  HIV:Non-reactive (08/20 0000)  GBS:   negative 1 hr Glucola: Normal Genetic screening: Normal Anatomy US: Normal  Admitting Diagnosis:  Active labor at term   Secondary Diagnoses: Patient Active Problem List   Diagnosis Date Noted  . Acute on chronic anemia 11/23/2017  . Postpartum care following vaginal delivery (3/6) 11/22/2017  . Normal labor 11/21/2017  . Uterine fibroid in pregnancy 06/11/2015  . SVD (spontaneous vaginal delivery) 06/11/2015    Augmentation: AROM Complications: None  Date of Delivery: 11/22/2017 Delivered By: Dr. Murrell Redden Delivery Type: spontaneous vaginal delivery Anesthesia: epidural Placenta: sponatneous Laceration:  Episiotomy: none  Newborn Data: Live born female  Birth Weight: 7 lb 6.9 oz (3371 g) APGAR: 8, 9  Newborn Delivery   Birth date/time:  11/22/2017 04:48:00 Delivery type:  Vaginal, Spontaneous     Hospital/Postpartum Course  (Vaginal Delivery): Pt. Was admitted in active labor.  See notes for details  Patient had an uncomplicated postpartum course.  By time of discharge on PPD#1, her pain was controlled on oral pain medications; she had appropriate lochia and was ambulating, voiding without difficulty and tolerating regular diet.  She was deemed stable for discharge to home.     Labs: CBC Latest Ref Rng & Units 11/23/2017 11/21/2017 06/12/2015  WBC 4.0 - 10.5 K/uL 13.0(H) 10.2 11.2(H)  Hemoglobin 12.0 - 15.0 g/dL 9.5(L)  10.1(L) 9.8(L)  Hematocrit 36.0 - 46.0 % 29.1(L) 31.1(L) 30.6(L)  Platelets 150 - 400 K/uL 164 189 119(L)   B POS  Physical exam:  BP (!) 113/57 (BP Location: Left Arm)   Pulse 79   Temp 97.7 F (36.5 C) (Axillary)   Resp 18   Ht 5\' 4"  (1.626 m)   Wt 73.5 kg (162 lb)   SpO2 99%   Breastfeeding? Unknown   BMI 27.81 kg/m  General: alert and no distress Pulm: normal respiratory effort Lochia: appropriate Abdomen: soft, NT Uterine Fundus: firm, below umbilicus Perineum: healing well, no significant erythema, no significant edema Extremities: No evidence of DVT seen on physical exam. No lower extremity edema.   Disposition: stable, discharge to home Baby Feeding: breast milk Baby Disposition: home with mom  Contraception: unsure   Rh Immune globulin given: N/A Rubella vaccine given: N/a Tdap vaccine given in AP or PP setting: UTD Flu vaccine given in AP or PP setting: UTD   Plan:  Sheanna O'Berry was discharged to home in good condition. Follow-up appointment at [facility] in 6 weeks.  Discharge Instructions: Per After Visit Summary. Refer to After Visit Summary and [facility] discharge booklet  Activity: Advance as tolerated. Pelvic rest for 6 weeks.   Diet: Regular, Heart Healthy Discharge Medications: Allergies as of 11/23/2017   No Known Allergies     Medication List    TAKE these medications   fluticasone 50 MCG/ACT nasal spray Commonly known as:  FLONASE Place 1 spray into both nostrils daily as needed for allergies or rhinitis.   ibuprofen 600 MG tablet Commonly known as:  ADVIL,MOTRIN Take 1 tablet (600 mg total)  by mouth every 6 (six) hours.   magnesium oxide 400 (241.3 Mg) MG tablet Commonly known as:  MAG-OX Take 0.5 tablets (200 mg total) by mouth daily.   multivitamin-prenatal 27-0.8 MG Tabs tablet Take 1 tablet by mouth daily at 12 noon.      Outpatient follow up:  Follow-up Information    Azucena Fallen, MD. Schedule an  appointment as soon as possible for a visit in 6 week(s).   Specialty:  Obstetrics and Gynecology Why:  Postpartum visit Contact information: [address] [place] [address] [phone]           Signed:  Lars Pinks, MSN, CNM [facility]

## 2017-12-01 ENCOUNTER — Inpatient Hospital Stay (HOSPITAL_COMMUNITY)
Admission: AD | Admit: 2017-12-01 | Discharge: 2017-12-08 | DRG: 776 | Disposition: A | Discharge status: Home / Self Care | Payer: No Typology Code available for payment source | Source: Ambulatory Visit | Attending: Obstetrics & Gynecology | Admitting: Obstetrics & Gynecology

## 2017-12-01 ENCOUNTER — Encounter (HOSPITAL_COMMUNITY): Payer: Self-pay | Admitting: *Deleted

## 2017-12-01 ENCOUNTER — Inpatient Hospital Stay (HOSPITAL_COMMUNITY): Payer: No Typology Code available for payment source

## 2017-12-01 DIAGNOSIS — O864 Pyrexia of unknown origin following delivery: Secondary | ICD-10-CM | POA: Diagnosis present

## 2017-12-01 DIAGNOSIS — O9089 Other complications of the puerperium, not elsewhere classified: Secondary | ICD-10-CM

## 2017-12-01 DIAGNOSIS — D509 Iron deficiency anemia, unspecified: Secondary | ICD-10-CM | POA: Diagnosis present

## 2017-12-01 DIAGNOSIS — O8612 Endometritis following delivery: Secondary | ICD-10-CM | POA: Diagnosis present

## 2017-12-01 DIAGNOSIS — R1030 Lower abdominal pain, unspecified: Secondary | ICD-10-CM | POA: Diagnosis not present

## 2017-12-01 DIAGNOSIS — O9081 Anemia of the puerperium: Secondary | ICD-10-CM | POA: Diagnosis present

## 2017-12-01 DIAGNOSIS — N719 Inflammatory disease of uterus, unspecified: Secondary | ICD-10-CM

## 2017-12-01 DIAGNOSIS — R52 Pain, unspecified: Secondary | ICD-10-CM

## 2017-12-01 DIAGNOSIS — O8619 Other infection of genital tract following delivery: Secondary | ICD-10-CM | POA: Diagnosis not present

## 2017-12-01 DIAGNOSIS — Z452 Encounter for adjustment and management of vascular access device: Secondary | ICD-10-CM

## 2017-12-01 DIAGNOSIS — N739 Female pelvic inflammatory disease, unspecified: Secondary | ICD-10-CM | POA: Diagnosis present

## 2017-12-01 DIAGNOSIS — N730 Acute parametritis and pelvic cellulitis: Secondary | ICD-10-CM

## 2017-12-01 HISTORY — DX: Female pelvic inflammatory disease, unspecified: N73.9

## 2017-12-01 LAB — CBC WITH DIFFERENTIAL/PLATELET
Basophils Absolute: 0 10*3/uL (ref 0.0–0.1)
Basophils Relative: 0 %
Eosinophils Absolute: 0.3 10*3/uL (ref 0.0–0.7)
Eosinophils Relative: 2 %
HCT: 33.1 % — ABNORMAL LOW (ref 36.0–46.0)
Hemoglobin: 10.6 g/dL — ABNORMAL LOW (ref 12.0–15.0)
Lymphocytes Relative: 16 %
Lymphs Abs: 2.3 10*3/uL (ref 0.7–4.0)
MCH: 25.2 pg — ABNORMAL LOW (ref 26.0–34.0)
MCHC: 32 g/dL (ref 30.0–36.0)
MCV: 78.6 fL (ref 78.0–100.0)
Monocytes Absolute: 0.4 10*3/uL (ref 0.1–1.0)
Monocytes Relative: 3 %
Neutro Abs: 11.3 10*3/uL — ABNORMAL HIGH (ref 1.7–7.7)
Neutrophils Relative %: 79 %
Platelets: 319 10*3/uL (ref 150–400)
RBC: 4.21 MIL/uL (ref 3.87–5.11)
RDW: 15 % (ref 11.5–15.5)
WBC: 14.3 10*3/uL — ABNORMAL HIGH (ref 4.0–10.5)

## 2017-12-01 NOTE — MAU Note (Signed)
SVD 3/6. Has had lower abd pain and lower back pain since delivered. Had temp yesterday of 102.6 and started on Augmentin last pm. Saw Dr Benjie Karvonen this am and thinking may have endometritis. Does have fibroid. Abd pain worse tonight. Bleeding is small amt.

## 2017-12-01 NOTE — MAU Provider Note (Signed)
History     CSN: [id number]  Arrival date and time: 12/01/17 2145  Chief Complaint  Patient presents with  . Abdominal Pain   HPI Sydney Little is a 37 y.o. [id number] postpartum from a vaginal delivery on 3/6 who presents with lower abdominal pain. She was started on augmentin on 3/14 for possible endometritis and seen by Dr. Benjie Karvonen today in the office for blood work. She states tonight she felt like pain was getting worse. She took tylenol at 7pm with minimal relief. She also reports fevers at home. She did not check her temperature tonight but feels like she had a fever that is now "broke."   OB History    Gravida Para Term Preterm AB Living   5 3 3   2 3    SAB TAB Ectopic Multiple Live Births     2   0 3      Past Medical History:  Diagnosis Date  . Acute blood loss anemia 12/17/2013  . Acute blood loss anemia 06/12/2015  . Fibroid    large 10cm  . Postpartum care following vaginal delivery (3/29) 12/15/2013  . SVD (spontaneous vaginal delivery) 12/17/2013  . Vaginal Pap smear, abnormal     Past Surgical History:  Procedure Laterality Date  . DILATION AND CURETTAGE OF UTERUS     2001/2004  . WISDOM TOOTH EXTRACTION      Family History  Problem Relation Age of Onset  . Hypertension Father   . Cancer Mother        breast  . Cancer Maternal Grandfather        pancreatic  . Cancer Paternal Grandmother        lymphoma  . Cancer Paternal Grandfather        prostate    Social History   Tobacco Use  . Smoking status: Never Smoker  . Smokeless tobacco: Never Used  Substance Use Topics  . Alcohol use: Yes  . Drug use: No    Allergies: No Known Allergies  Medications Prior to Admission  Medication Sig Dispense Refill Last Dose  . acetaminophen (TYLENOL) 500 MG tablet Take 500 mg by mouth every 6 (six) hours as needed.   12/01/2017 at 1930  . amoxicillin-clavulanate (AUGMENTIN) 875-125 MG tablet Take 1 tablet by mouth 2 (two) times daily.   12/01/2017 at Unknown time   . fluticasone (FLONASE) 50 MCG/ACT nasal spray Place 1 spray into both nostrils daily as needed for allergies or rhinitis.   Past Week at Unknown time  . ibuprofen (ADVIL,MOTRIN) 600 MG tablet Take 1 tablet (600 mg total) by mouth every 6 (six) hours. 30 tablet 0 12/01/2017 at 1930  . magnesium oxide (MAG-OX) 400 (241.3 MG) MG tablet Take 0.5 tablets (200 mg total) by mouth daily. 30 tablet 3 Past Week at Unknown time  . Prenatal Vit-Fe Fumarate-FA (MULTIVITAMIN-PRENATAL) 27-0.8 MG TABS tablet Take 1 tablet by mouth daily at 12 noon.   12/01/2017 at Unknown time    Review of Systems  Constitutional: Negative.  Negative for fatigue and fever.  HENT: Negative.   Respiratory: Negative.  Negative for shortness of breath.   Cardiovascular: Negative.  Negative for chest pain.  Gastrointestinal: Positive for abdominal pain. Negative for constipation, diarrhea, nausea and vomiting.  Genitourinary: Positive for vaginal bleeding. Negative for dysuria and vaginal discharge.  Neurological: Negative.  Negative for dizziness and headaches.   Physical Exam   Blood pressure 118/65, pulse 79, temperature 98.6 F (37 C), resp. rate 18, height 5'  5" (1.651 m), weight 146 lb (66.2 kg), currently breastfeeding.  Physical Exam  Nursing note and vitals reviewed. Constitutional: She is oriented to person, place, and time. She appears well-developed and well-nourished. No distress.  HENT:  Head: Normocephalic.  Eyes: Pupils are equal, round, and reactive to light.  Cardiovascular: Normal rate, regular rhythm and normal heart sounds.  Respiratory: Effort normal and breath sounds normal. No respiratory distress.  GI: Soft. Bowel sounds are normal. She exhibits no distension. There is tenderness. There is guarding.  Neurological: She is alert and oriented to person, place, and time.  Skin: Skin is warm and dry.  Psychiatric: She has a normal mood and affect. Her behavior is normal. Judgment and thought content  normal.    MAU Course  Procedures Results for orders placed or performed during the hospital encounter of 12/01/17 (from the past 24 hour(s))  CBC with Differential/Platelet     Status: Abnormal   Collection Time: 12/01/17 10:27 PM  Result Value Ref Range   WBC 14.3 (H) 4.0 - 10.5 K/uL   RBC 4.21 3.87 - 5.11 MIL/uL   Hemoglobin 10.6 (L) 12.0 - 15.0 g/dL   HCT 33.1 (L) 36.0 - 46.0 %   MCV 78.6 78.0 - 100.0 fL   MCH 25.2 (L) 26.0 - 34.0 pg   MCHC 32.0 30.0 - 36.0 g/dL   RDW 15.0 11.5 - 15.5 %   Platelets 319 150 - 400 K/uL   Neutrophils Relative % 79 %   Neutro Abs 11.3 (H) 1.7 - 7.7 K/uL   Lymphocytes Relative 16 %   Lymphs Abs 2.3 0.7 - 4.0 K/uL   Monocytes Relative 3 %   Monocytes Absolute 0.4 0.1 - 1.0 K/uL   Eosinophils Relative 2 %   Eosinophils Absolute 0.3 0.0 - 0.7 K/uL   Basophils Relative 0 %   Basophils Absolute 0.0 0.0 - 0.1 K/uL   US Pelvis (transabdominal Only)  Result Date: 12/02/2017 CLINICAL DATA:  Postpartum pelvic pain and fever. EXAM: TRANSABDOMINAL ULTRASOUND OF PELVIS TECHNIQUE: Transabdominal ultrasound examination of the pelvis was performed including evaluation of the uterus, ovaries, adnexal regions, and pelvic cul-de-sac. COMPARISON:  None. FINDINGS: Uterus Measurements: 13.8 x 8.1 x 10.9 cm. Intramural fibroid at the left uterine fundus, measuring 2.1 x 1.4 x 2.5 cm. Endometrium Small amount of fluid in the endometrial cavity. Endometrium is not clearly measurable due to postpartum status. Right ovary Not visualized Left ovary Measurements: 3.7 x 2.0 x 2.8 cm. Normal appearance/no adnexal mass. Other findings:  Trace free fluid in the posterior cul-de-sac. IMPRESSION: 1. No acute abnormality of the uterus or endometrial cavity. 2. Small left fundal uterine fibroid. 3. Nonvisualization of the right ovary. Electronically Signed   By: Ulyses Jarred M.D.   On: 12/02/2017 00:19   MDM CBC US Pelvis Comp with Transvaginal Reviewed results with Dr. Benjie Karvonen- will  admit patient for IV antibiotics due to suspected endometritis  Assessment and Plan   1. Endometritis following delivery   2. Postpartum pain    -Admit to High Risk OB -IV antibiotics  Wende Mott CNM 12/01/2017, 10:21 PM

## 2017-12-02 ENCOUNTER — Other Ambulatory Visit: Payer: Self-pay

## 2017-12-02 ENCOUNTER — Observation Stay (HOSPITAL_COMMUNITY): Payer: No Typology Code available for payment source

## 2017-12-02 DIAGNOSIS — O864 Pyrexia of unknown origin following delivery: Secondary | ICD-10-CM | POA: Diagnosis present

## 2017-12-02 DIAGNOSIS — N719 Inflammatory disease of uterus, unspecified: Secondary | ICD-10-CM | POA: Diagnosis present

## 2017-12-02 MED ORDER — IOPAMIDOL (ISOVUE-300) INJECTION 61%
100.0000 mL | Freq: Once | INTRAVENOUS | Status: AC | PRN
  Administered 2017-12-02: 100 mL via INTRAVENOUS

## 2017-12-02 MED ORDER — OXYCODONE-ACETAMINOPHEN 5-325 MG PO TABS
1.0000 | ORAL_TABLET | Freq: Four times a day (QID) | ORAL | Status: DC | PRN
  Administered 2017-12-02 – 2017-12-06 (×8): 1 via ORAL
  Filled 2017-12-02 (×8): qty 1

## 2017-12-02 MED ORDER — RISAQUAD PO CAPS
2.0000 | ORAL_CAPSULE | Freq: Three times a day (TID) | ORAL | Status: DC
  Administered 2017-12-02 – 2017-12-08 (×16): 2 via ORAL
  Filled 2017-12-02 (×20): qty 2

## 2017-12-02 MED ORDER — CLINDAMYCIN PHOSPHATE 300 MG/50ML IV SOLN: 300.0000 mg | Freq: Four times a day (QID) | INTRAVENOUS | Status: DC

## 2017-12-02 MED ORDER — SODIUM CHLORIDE 0.9 % IV SOLN
2.0000 g | Freq: Four times a day (QID) | INTRAVENOUS | Status: DC
  Administered 2017-12-02 – 2017-12-08 (×25): 2 g via INTRAVENOUS
  Filled 2017-12-02 (×27): qty 2

## 2017-12-02 MED ORDER — BACID PO TABS
2.0000 | ORAL_TABLET | Freq: Three times a day (TID) | ORAL | Status: DC
  Filled 2017-12-02 (×2): qty 2

## 2017-12-02 MED ORDER — CLINDAMYCIN PHOSPHATE 900 MG/50ML IV SOLN
900.0000 mg | Freq: Three times a day (TID) | INTRAVENOUS | Status: DC
Start: 2017-12-02 — End: 2017-12-08
  Administered 2017-12-02 – 2017-12-08 (×17): 900 mg via INTRAVENOUS
  Filled 2017-12-02 (×18): qty 50

## 2017-12-02 MED ORDER — IBUPROFEN 600 MG PO TABS
600.0000 mg | ORAL_TABLET | Freq: Four times a day (QID) | ORAL | Status: DC | PRN
  Administered 2017-12-02 – 2017-12-08 (×10): 600 mg via ORAL
  Filled 2017-12-02 (×10): qty 1

## 2017-12-02 NOTE — Progress Notes (Signed)
Subjective: Suprapubic/ groin/ pubic symphysis pain ans some radiating to the hips. Fever none since here but some cold sweats/ chills.   Objective: Vital signs in last 24 hours: Temp:  [98.2 F (36.8 C)-99 F (37.2 C)] 98.5 F (36.9 C) (03/16 1200) Pulse Rate:  [60-84] 84 (03/16 1200) Resp:  [18] 18 (03/16 1200) BP: (102-118)/(54-68) 113/68 (03/16 1200) SpO2:  [95 %-100 %] 97 % (03/16 1200) Weight:  [146 lb (66.2 kg)] 146 lb (66.2 kg) (03/15 2153) Weight change:  Last BM Date: 12/01/17  General appearance: alert and cooperative Resp: clear to auscultation bilaterally Cardio: regular rate and rhythm, S1, S2 normal, no murmur, click, rub or gallop GI: soft, slight lower abdominal/ groin tenderness. BS normal; no abdo masses,  no organomegaly Extremities: extremities normal, atraumatic, no cyanosis or edema Pulses: 2+ and symmetric Pelvic deferred   CBC Latest Ref Rng & Units 12/01/2017 11/23/2017 11/21/2017  WBC 4.0 - 10.5 K/uL 14.3(H) 13.0(H) 10.2  Hemoglobin 12.0 - 15.0 g/dL 10.6(L) 9.5(L) 10.1(L)  Hematocrit 36.0 - 46.0 % 33.1(L) 29.1(L) 31.1(L)  Platelets 150 - 400 K/uL 319 164 189    Studies/Results: US Pelvis (transabdominal Only)  Result Date: 12/02/2017 CLINICAL DATA:  Postpartum pelvic pain and fever. EXAM: TRANSABDOMINAL ULTRASOUND OF PELVIS TECHNIQUE: Transabdominal ultrasound examination of the pelvis was performed including evaluation of the uterus, ovaries, adnexal regions, and pelvic cul-de-sac. COMPARISON:  None. FINDINGS: Uterus Measurements: 13.8 x 8.1 x 10.9 cm. Intramural fibroid at the left uterine fundus, measuring 2.1 x 1.4 x 2.5 cm. Endometrium Small amount of fluid in the endometrial cavity. Endometrium is not clearly measurable due to postpartum status. Right ovary Not visualized Left ovary Measurements: 3.7 x 2.0 x 2.8 cm. Normal appearance/no adnexal mass. Other findings:  Trace free fluid in the posterior cul-de-sac. IMPRESSION: 1. No acute abnormality of the  uterus or endometrial cavity. 2. Small left fundal uterine fibroid. 3. Nonvisualization of the right ovary. Electronically Signed   By: Ulyses Jarred M.D.   On: 12/02/2017 00:19   Medications:  .  ibuprofen (ADVIL,MOTRIN) tablet 600 mg every 6 hrs/ prn .  cefOXitin (MEFOXIN) 2 g every 6 hrs   Assessment/Plan:  LOS: 1 day  Postpartum day 10, fever/ lower abdominal pain.  Treating as endometritis since no other focal findings. Sono - normal, no retained POCs/ vascularity in uterus or pelvic mass  Pt very concerned since no such prior experience with other 2 deliveries. Feels needs more evaluation though fever has resolved.  Plan Abdo/pelvic CT with OV contrast. CT exposure risk reviewed.  Continue Ceftriaxone.   V.Camden Mazzaferro,MD

## 2017-12-02 NOTE — Progress Notes (Signed)
Patient ID: Sydney Little, female   DOB: [birth year], 37 y.o.   MRN: [id number] CT reviewed with radiologist Endometritis and small anterior to uterus abscess   Will add Clindamycin 900mg  OV q8hr for anaerobic coverage.   V.Stryder Poitra, MD

## 2017-12-02 NOTE — Progress Notes (Signed)
Pt being wheeled down for CT scan.

## 2017-12-02 NOTE — H&P (Signed)
Sydney Little is an 37 y.o. female.   Chief Complaint: Abdominal pain and fever 2 days.   HPI: 37 yo, [id number], recent term SVD on 11/22/17 after spontaneous labor, AROM just before pushing, no infection and discharged at 24 hours on 3/7. Was doing well but started to feel lower abdominal pain and fever of 102 at home on 3/14, called office with symptoms, was started on Augmentin for presumed endometritis and was asked to come to office for eval next morning.   Denies dysuria, though there is pain in groin/ pelvic area when sits for urination or bm, pain with getting out of the bed, moving in bed. No cough/ URI symptoms/ no sick contact / no rash. No leg pain/ swelling. No breast complaints/ pumping and feeding well, no lump/redness. No HA/ vision changes. No vomiting/ nausea/ lack of appetite.  She has a uterine fibroid that grows to 10 cm in pregnancy and becomes small postpartum usually 2-3 cm.   Past Medical History:  Diagnosis Date  . Acute blood loss anemia 12/17/2013  . Acute blood loss anemia 06/12/2015  . Fibroid    large 10cm  . Postpartum care following vaginal delivery (3/29) 12/15/2013  . SVD (spontaneous vaginal delivery) 12/17/2013  . Vaginal Pap smear, abnormal     Past Surgical History:  Procedure Laterality Date  . DILATION AND CURETTAGE OF UTERUS     2001/2004  . WISDOM TOOTH EXTRACTION      Family History  Problem Relation Age of Onset  . Hypertension Father   . Cancer Mother        breast  . Cancer Maternal Grandfather        pancreatic  . Cancer Paternal Grandmother        lymphoma  . Cancer Paternal Grandfather        prostate   Social History:  reports that  has never smoked. she has never used smokeless tobacco. She reports that she drinks alcohol. She reports that she does not use drugs.  Allergies: No Known Allergies  Medications Prior to Admission  Medication Sig Dispense Refill  . acetaminophen (TYLENOL) 500 MG tablet Take 500 mg by mouth every 6  (six) hours as needed for mild pain or headache.     Marland Kitchen amoxicillin-clavulanate (AUGMENTIN) 875-125 MG tablet Take 1 tablet by mouth 2 (two) times daily.    Marland Kitchen ibuprofen (ADVIL,MOTRIN) 600 MG tablet Take 1 tablet (600 mg total) by mouth every 6 (six) hours. 30 tablet 0  . magnesium oxide (MAG-OX) 400 (241.3 MG) MG tablet Take 0.5 tablets (200 mg total) by mouth daily. 30 tablet 3  . Prenatal Vit-Fe Fumarate-FA (MULTIVITAMIN-PRENATAL) 27-0.8 MG TABS tablet Take 1 tablet by mouth daily at 12 noon.      ROS  Above   Blood pressure 113/68, pulse 84, temperature 98.5 F (36.9 C), temperature source Oral, resp. rate 18, height 5\' 5"  (1.651 m), weight 146 lb (66.2 kg), SpO2 97 %, currently breastfeeding. Physical Exam A&O x 3, no acute distress. Pleasant HEENT neg, no thyromegaly Lungs CTA bilat CV RRR, S1S2 normal Abdo soft, lower abdomina/ suprapubic bony tenderness and some guarding in suprapubic area. Active BS Extr no edema/ tenderness Pelvic deferred- in office on 3/15 AM- uterus bulky, slightly tender but no adnexal mass, no foul discharge      CBC with Differential/Platelet     Status: Abnormal   Collection Time: 12/01/17 10:27 PM  Result Value Ref Range   WBC 14.3 (H) 4.0 - 10.5  K/uL   RBC 4.21 3.87 - 5.11 MIL/uL   Hemoglobin 10.6 (L) 12.0 - 15.0 g/dL   HCT 33.1 (L) 36.0 - 46.0 %   MCV 78.6 78.0 - 100.0 fL   MCH 25.2 (L) 26.0 - 34.0 pg   MCHC 32.0 30.0 - 36.0 g/dL   RDW 15.0 11.5 - 15.5 %   Platelets 319 150 - 400 K/uL   Neutrophils Relative % 79 %   Neutro Abs 11.3 (H) 1.7 - 7.7 K/uL   Lymphocytes Relative 16 %   Lymphs Abs 2.3 0.7 - 4.0 K/uL   Monocytes Relative 3 %   Monocytes Absolute 0.4 0.1 - 1.0 K/uL   Eosinophils Relative 2 %   Eosinophils Absolute 0.3 0.0 - 0.7 K/uL   Basophils Relative 0 %   Basophils Absolute 0.0 0.0 - 0.1 K/uL    Comment: Performed at [facility], [address] [place], [place] [address]   Office UA- clear, urine culture sent.  Office  CBC this morning - WBC 12    US Pelvis (transabdominal Only) Result Date: 12/02/2017 CLINICAL DATA:  Postpartum pelvic pain and fever. EXAM: TRANSABDOMINAL ULTRASOUND OF PELVIS TECHNIQUE: Transabdominal ultrasound examination of the pelvis was performed including evaluation of the uterus, ovaries, adnexal regions, and pelvic cul-de-sac. COMPARISON:  None. FINDINGS: Uterus Measurements: 13.8 x 8.1 x 10.9 cm. Intramural fibroid at the left uterine fundus, measuring 2.1 x 1.4 x 2.5 cm. Endometrium Small amount of fluid in the endometrial cavity. Endometrium is not clearly measurable due to postpartum status. Right ovary Not visualized Left ovary Measurements: 3.7 x 2.0 x 2.8 cm. Normal appearance/no adnexal mass. Other findings:  Trace free fluid in the posterior cul-de-sac. IMPRESSION: 1. No acute abnormality of the uterus or endometrial cavity. 2. Small left fundal uterine fibroid. 3. Nonvisualization of the right ovary. Electronically Signed   By: Ulyses Jarred M.D.   On: 12/02/2017 00:19    Assessment/Plan Postpartum day 10. Puerperal fever. 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

## 2017-12-02 NOTE — Plan of Care (Signed)
Pts. Condition will continue to improve 

## 2017-12-02 NOTE — Progress Notes (Signed)
Patient refused abdominal binder

## 2017-12-03 DIAGNOSIS — R1030 Lower abdominal pain, unspecified: Secondary | ICD-10-CM | POA: Diagnosis present

## 2017-12-03 DIAGNOSIS — Z95828 Presence of other vascular implants and grafts: Secondary | ICD-10-CM | POA: Diagnosis not present

## 2017-12-03 DIAGNOSIS — Z978 Presence of other specified devices: Secondary | ICD-10-CM | POA: Diagnosis not present

## 2017-12-03 DIAGNOSIS — R300 Dysuria: Secondary | ICD-10-CM | POA: Diagnosis not present

## 2017-12-03 DIAGNOSIS — O864 Pyrexia of unknown origin following delivery: Secondary | ICD-10-CM | POA: Diagnosis not present

## 2017-12-03 DIAGNOSIS — O9081 Anemia of the puerperium: Secondary | ICD-10-CM | POA: Diagnosis present

## 2017-12-03 DIAGNOSIS — O8619 Other infection of genital tract following delivery: Secondary | ICD-10-CM | POA: Diagnosis present

## 2017-12-03 DIAGNOSIS — O8612 Endometritis following delivery: Secondary | ICD-10-CM | POA: Diagnosis not present

## 2017-12-03 DIAGNOSIS — D509 Iron deficiency anemia, unspecified: Secondary | ICD-10-CM | POA: Diagnosis not present

## 2017-12-03 LAB — CBC
HCT: 33.1 % — ABNORMAL LOW (ref 36.0–46.0)
Hemoglobin: 10.6 g/dL — ABNORMAL LOW (ref 12.0–15.0)
MCH: 25.4 pg — ABNORMAL LOW (ref 26.0–34.0)
MCHC: 32 g/dL (ref 30.0–36.0)
MCV: 79.4 fL (ref 78.0–100.0)
Platelets: 310 10*3/uL (ref 150–400)
RBC: 4.17 MIL/uL (ref 3.87–5.11)
RDW: 15.2 % (ref 11.5–15.5)
WBC: 10.1 10*3/uL (ref 4.0–10.5)

## 2017-12-03 NOTE — Progress Notes (Signed)
Subjective: Feels a bit better today. Took percocet last night but no pain med today. Pain with ambulation better but still hurt with voiding/ bm.  No fever/ chills    Objective: Vital signs in last 24 hours: Temp:  [97.9 F (36.6 C)-99 F (37.2 C)] 98 F (36.7 C) (03/17 0847) Pulse Rate:  [56-76] 71 (03/17 0847) Resp:  [15-18] 16 (03/17 0847) BP: (94-117)/(50-71) 106/71 (03/17 0847) SpO2:  [98 %-100 %] 100 % (03/17 0847) Weight change:  Last BM Date: 12/02/17  General appearance: alert and cooperative GI: soft, slight lower abdominal tenderness nut better.  Extremities: extremities normal, atraumatic, no cyanosis or edema Pelvic deferred   CBC Latest Ref Rng & Units 12/03/2017 12/01/2017 11/23/2017  WBC 4.0 - 10.5 K/uL 10.1 14.3(H) 13.0(H)  Hemoglobin 12.0 - 15.0 g/dL 10.6(L) 10.6(L) 9.5(L)  Hematocrit 36.0 - 46.0 % 33.1(L) 33.1(L) 29.1(L)  Platelets 150 - 400 K/uL 310 319 164    Studies/Results: US Pelvis (transabdominal Only)  Result Date: 12/02/2017 CLINICAL DATA:  Postpartum pelvic pain and fever. EXAM: TRANSABDOMINAL ULTRASOUND OF PELVIS TECHNIQUE: Transabdominal ultrasound examination of the pelvis was performed including evaluation of the uterus, ovaries, adnexal regions, and pelvic cul-de-sac. COMPARISON:  None. FINDINGS: Uterus Measurements: 13.8 x 8.1 x 10.9 cm. Intramural fibroid at the left uterine fundus, measuring 2.1 x 1.4 x 2.5 cm. Endometrium Small amount of fluid in the endometrial cavity. Endometrium is not clearly measurable due to postpartum status. Right ovary Not visualized Left ovary Measurements: 3.7 x 2.0 x 2.8 cm. Normal appearance/no adnexal mass. Other findings:  Trace free fluid in the posterior cul-de-sac. IMPRESSION: 1. No acute abnormality of the uterus or endometrial cavity. 2. Small left fundal uterine fibroid. 3. Nonvisualization of the right ovary. Electronically Signed   By: Ulyses Jarred M.D.   On: 12/02/2017 00:19   Ct Abdomen Pelvis W  Contrast  Result Date: 12/02/2017 CLINICAL DATA:  37 year old female with history of right-sided flank and lower pelvic pain with fever to 102 degrees. Patient is recently postpartum with history of spontaneous vaginal delivery on 11/22/2017. EXAM: CT ABDOMEN AND PELVIS WITH CONTRAST TECHNIQUE: Multidetector CT imaging of the abdomen and pelvis was performed using the standard protocol following bolus administration of intravenous contrast. CONTRAST:  129mL ISOVUE-300 IOPAMIDOL (ISOVUE-300) INJECTION 61% COMPARISON:  No priors. FINDINGS: Lower chest: Unremarkable. Hepatobiliary: No cystic or solid hepatic lesions. No intra or extrahepatic biliary ductal dilatation. Gallbladder is normal in appearance. Pancreas: No pancreatic mass. No pancreatic ductal dilatation. No pancreatic or peripancreatic fluid or inflammatory changes. Spleen: Unremarkable. Adrenals/Urinary Tract: Several nonobstructive calculi are noted within the collecting systems of both kidneys, largest of which measures up to 7 mm in the interpolar region of the right kidney, potentially within a calyceal diverticulum. No suspicious renal lesions. No hydroureteronephrosis. Urinary bladder is normal in appearance. Stomach/Bowel: Normal appearance of the stomach. No pathologic dilatation of small bowel or colon. Normal appendix. Vascular/Lymphatic: No atherosclerotic disease, aneurysm or dissection noted in the abdominal or pelvic vasculature. No lymphadenopathy noted in the abdomen or pelvis. Reproductive: Postpartum uterus appears enlarged measuring 10.4 x 13.5 x 7.7 cm, with persistent ill-defined thickening of the endometrium which measures up to 16 mm. Immediately anterior to the uterus there is a multilocular fluid collection which demonstrates some peripheral enhancement (coronal image 43 of series 602 and sagittal image 85 of series 603) measuring approximately 2.1 x 9.8 x 2.7 cm, with adjacent inflammatory changes in the surrounding fat,  concerning for a small abscess. Ovaries are  unremarkable in appearance. Other: No significant volume of ascites.  No pneumoperitoneum. Musculoskeletal: There are no aggressive appearing lytic or blastic lesions noted in the visualized portions of the skeleton. IMPRESSION: 1. Persistent enlargement of the uterus with ill-defined thickened endometrium, which could indicate postpartum endometritis. In addition, there is a small abscess anterior to the uterine body with extensive surrounding inflammatory changes, as above. 2. Normal appendix. 3. Nonobstructive calculi in the collecting systems of both kidneys measuring up to 7 mm on the right side. Electronically Signed   By: Vinnie Langton M.D.   On: 12/02/2017 18:13   Medications:  - Ibuprofen (ADVIL,MOTRIN) tablet 600 mg every 6 hrs/ prn - Added Clindamycin 900 mg IV q 8 hrs yesterday after CT - CefOXitin (MEFOXIN) 2 g every 6 hrs  - Probiotic   Assessment/Plan: HD #2 Postpartum day 11, Endometritis and small anterior cul-de sac (anterior to the uterus) abscess and inflammation  Continue Cefoxitin/ Clindamycin IV until 48 hrs of Clindamycin Anticipate D/c tomorrow after noon with PO Augmentin and Clindamycin 8 more days NSAIDs prn  V.Jhovani Griswold,MD

## 2017-12-04 MED ORDER — ONDANSETRON HCL 4 MG PO TABS
4.0000 mg | ORAL_TABLET | Freq: Once | ORAL | Status: AC
  Administered 2017-12-04: 4 mg via ORAL
  Filled 2017-12-04: qty 1

## 2017-12-04 MED ORDER — KETOROLAC TROMETHAMINE 30 MG/ML IJ SOLN
30.0000 mg | Freq: Once | INTRAMUSCULAR | Status: AC
  Administered 2017-12-04: 30 mg via INTRAVENOUS
  Filled 2017-12-04: qty 1

## 2017-12-04 NOTE — Progress Notes (Signed)
Subjective: Not feeling well this morning. Woke up not feeling well, having nausea. Has normal bm and nausea and pain got worse. Very uncomfortable moving in bed.   Objective: Vital signs in last 24 hours: Temp:  [98.4 F (36.9 C)-99 F (37.2 C)] 98.8 F (37.1 C) (03/18 0812) Pulse Rate:  [64-96] 96 (03/18 0812) Resp:  [18] 18 (03/18 0812) BP: (102-133)/(61-80) 111/73 (03/18 0812) SpO2:  [98 %-100 %] 100 % (03/18 0812) Weight change:  Last BM Date: 12/03/17 Afebrile General appearance: alert and cooperative GI: soft, lower abdomen tender and guarding. Normal active bowel sounds with no peritoneal signs Extremities: extremities normal, atraumatic, no cyanosis or edema Pelvic deferred   CBC Latest Ref Rng & Units 12/03/2017 12/01/2017 11/23/2017  WBC 4.0 - 10.5 K/uL 10.1 14.3(H) 13.0(H)  Hemoglobin 12.0 - 15.0 g/dL 10.6(L) 10.6(L) 9.5(L)  Hematocrit 36.0 - 46.0 % 33.1(L) 33.1(L) 29.1(L)  Platelets 150 - 400 K/uL 310 319 164    Studies/Results: Ct Abdomen Pelvis W Contrast 12/02/2017 Postpartum uterus appears enlarged measuring 10.4 x 13.5 x 7.7 cm, with persistent ill-defined thickening of the endometrium which measures up to 16 mm. Immediately anterior to the uterus there is a multilocular fluid collection which demonstrates some peripheral enhancement (coronal image 43 of series 602 and sagittal image 85 of series 603) measuring approximately 2.1 x 9.8 x 2.7 cm, with adjacent inflammatory changes in the surrounding fat, concerning for a small abscess. Ovaries are unremarkable in appearance. Other: No significant volume of ascites.  No pneumoperitoneum. Musculoskeletal: There are no aggressive appearing lytic or blastic lesions noted in the visualized portions of the skeleton.  IMPRESSION: 1. Persistent enlargement of the uterus with ill-defined thickened endometrium, which could indicate postpartum endometritis. In addition, there is a small abscess anterior to the uterine body with  extensive surrounding inflammatory changes, as above. 2. Normal appendix. 3. Nonobstructive calculi in the collecting systems of both kidneys measuring up to 7 mm on the right side.   Medications:  - Clindamycin 900 mg IV q 8 hrs (48 hrs this noon) - CefoXitin (MEFOXIN) 2 g every 6 hrs (since 3/15 night) - Probiotic  - Ibuprofen (ADVIL,MOTRIN) tablet 600 mg every 6 hrs/ prn - Percocet prn  Assessment/Plan: HD #3 Postpartum day 12, Endometritis and small anterior cul-de sac abscess and inflammation  Continue Cefoxitin/ Clindamycin IV  May need to extend stay due to new nausea and pain.  Possible repeat sono this afternoon if not better to assess if abscess worse.   V.Lakima Dona,MD

## 2017-12-05 ENCOUNTER — Inpatient Hospital Stay (HOSPITAL_COMMUNITY): Payer: No Typology Code available for payment source

## 2017-12-05 ENCOUNTER — Encounter (HOSPITAL_COMMUNITY): Payer: Self-pay | Admitting: Obstetrics & Gynecology

## 2017-12-05 DIAGNOSIS — O8612 Endometritis following delivery: Secondary | ICD-10-CM

## 2017-12-05 DIAGNOSIS — N739 Female pelvic inflammatory disease, unspecified: Secondary | ICD-10-CM

## 2017-12-05 DIAGNOSIS — D509 Iron deficiency anemia, unspecified: Secondary | ICD-10-CM

## 2017-12-05 HISTORY — DX: Female pelvic inflammatory disease, unspecified: N73.9

## 2017-12-05 LAB — CBC
HCT: 33.8 % — ABNORMAL LOW (ref 36.0–46.0)
Hemoglobin: 10.8 g/dL — ABNORMAL LOW (ref 12.0–15.0)
MCH: 24.8 pg — ABNORMAL LOW (ref 26.0–34.0)
MCHC: 32 g/dL (ref 30.0–36.0)
MCV: 77.7 fL — ABNORMAL LOW (ref 78.0–100.0)
Platelets: 336 10*3/uL (ref 150–400)
RBC: 4.35 MIL/uL (ref 3.87–5.11)
RDW: 15.1 % (ref 11.5–15.5)
WBC: 9 10*3/uL (ref 4.0–10.5)

## 2017-12-05 MED ORDER — ALPRAZOLAM 0.5 MG PO TABS
0.2500 mg | ORAL_TABLET | Freq: Two times a day (BID) | ORAL | Status: DC | PRN
  Administered 2017-12-08: 0.25 mg via ORAL
  Filled 2017-12-05: qty 1

## 2017-12-05 NOTE — Progress Notes (Signed)
Transportation set up with [facility].  Spoke with Marden Noble the ambulance will be here 12/06/17 around 9:30 a.m. To transport patient to [facility] radiology.  Patients appointment is at 11 a.m but patient should arrive at 10a.m.

## 2017-12-05 NOTE — Progress Notes (Addendum)
Subjective: Pain not resolved. Taking Percocet about 3 per day. NSAIDs not helping.  Anxious now since talking to her sister about abscess and she advised her she'll need PICC line for abx for 14 days and she should have ID consult since she is not better.   She is able to ambulate but has pain with movements and bending over. Percocet helps and was able to sleep. No fever/ chills/ sweats. No diarrhea.   Objective: Vital signs in last 24 hours: Temp:  [97.9 F (36.6 C)-98.7 F (37.1 C)] 97.9 F (36.6 C) (03/19 0815) Pulse Rate:  [63-75] 66 (03/19 0815) Resp:  [16-18] 18 (03/19 0815) BP: (92-112)/(51-60) 101/53 (03/19 0815) SpO2:  [98 %-100 %] 99 % (03/19 0815) Weight change:  Last BM Date: 12/04/17 Afebrile General appearance: alert and cooperative GI: soft, lower abdomen tender and guarding. Normal active bowel sounds with no peritoneal signs Extremities: extremities normal, atraumatic, no cyanosis or edema Pelvic deferred   CBC Latest Ref Rng & Units 12/05/2017 12/03/2017 12/01/2017  WBC 4.0 - 10.5 K/uL 9.0 10.1 14.3(H)  Hemoglobin 12.0 - 15.0 g/dL 10.8(L) 10.6(L) 10.6(L)  Hematocrit 36.0 - 46.0 % 33.8(L) 33.1(L) 33.1(L)  Platelets 150 - 400 K/uL 336 310 319    BMP tomorrow for CT guided drainage   Studies/Results: Ct Abdomen Pelvis W Contrast 12/02/2017 Postpartum uterus appears enlarged measuring 10.4 x 13.5 x 7.7 cm, with persistent ill-defined thickening of the endometrium which measures up to 16 mm. Immediately anterior to the uterus there is a multilocular fluid collection which demonstrates some peripheral enhancement (coronal image 43 of series 602 and sagittal image 85 of series 603) measuring approximately 2.1 x 9.8 x 2.7 cm, with adjacent inflammatory changes in the surrounding fat, concerning for a small abscess. Ovaries are unremarkable in appearance. Other: No significant volume of ascites.  No pneumoperitoneum. Musculoskeletal: There are no aggressive appearing lytic or  blastic lesions noted in the visualized portions of the skeleton.  IMPRESSION: 1. Persistent enlargement of the uterus with ill-defined thickened endometrium, which could indicate postpartum endometritis. In addition, there is a small abscess anterior to the uterine body with extensive surrounding inflammatory changes, as above. 2. Normal appendix. 3. Nonobstructive calculi in the collecting systems of both kidneys measuring up to 7 mm on the right side.   US Pelvis 12/05/2017:  There is a complex cystic mass within the pelvis superior to the uterus measuring 9.4 x 4.3 x 3.6 cm. There is an additional complex fluid collection inferior to the right ovary measuring 3.3 x 2.0 x 3.1 cm.  IMPRESSION: 1. Nonspecific complex fluid collection anterior and superior to the uterus which may represent abscess. Additionally there is a smaller complex fluid collection inferior and posterior to the right ovary. 2. Endometrium is thickened which is nonspecific and may represent endometrial blood products. Postpartum endometritis is a consideration. Hypovascular retained products of conception not excluded. Recommend short-term follow-up pelvic ultrasound  Medications:  - CefoXitin (MEFOXIN) 2 g every 6 hrs (since 3/15 night, day 5) and Clindamycin 900 mg IV q 8 hrs (3/16 Noon, day 4) - Probiotic  - Ibuprofen prn - Percocet prn  Assessment/Plan: HD # 5 Postpartum day 13, low risk pregnancy/ vaginal birth. Now with endometritis (from postpartum 7) and anterior pelvic abscess. CBC, fever resolved, pain is better but not resolved and abscess is larger On Cefoxitin/ Clindamycin IV but considering change to preferred PID/abscess regimen of Levoflxacin/ Flagyl if ID agrees.  IR guided drainage tomorrow and may leave drain  if needed. At [facility] 11 am. Plan to send fluid cultures    Patient is breast feeding but accepts pump/dump if needed if antibiotic change is preferred. Typical PID/abscess Rx is Levofloxacin  500mg  daily/ Flagyl 500mg  bid (IV 48hrs and PO total 14 days) d/w pt . Marland Kitchen Checking with ID for any other recommendation.  Spoke with Dr Johnnye Sima - ID, he will see patient today.   V.Aurie Harroun, MD Cell: [phone]

## 2017-12-05 NOTE — Consult Note (Signed)
[facility] for [facility]    Date of Admission:  12/01/2017   Total days of antibiotics: 3 clinda/cefoxitin               Reason for Consult: post-partum pelvic abscess    Referring Provider: Mody   Assessment: Post-partum Pelvic Abscess Endometrititis Anemia, microcytic  Plan: 1. Continue cefoxitin and clinda 2. Await IR drain and Cx in AM.  3. Check HIV  Comment- I believe her anbx are working at some level (the clinda adds staph coverage, both have anaerobic coverage) as she is afebrile and her WBC has normalized. Some of this may be due to her anti-pyretics.   Most importantly she needs drainage. I suspect this will be the factor that leads to continued improvement.  Hopefully her drain will give [place] a meaningful Cx, it is possible it will be negative as she has gotten anbx for 3 days.  I suspect she will need follow up imaging after drain placed and then a decision can be made as to wether she needs to go home with po or with a PIC line.  If she decompensates, would consider adding vanco and perhaps changing her clinda/cefoxitin to merrem.  She is clear that improving this infection is paramount over any concerns she has about breast feeding.    I explained this at length to the pt and her sister (CCM MD in [place]).   Thank you so much for this interesting consult,  Principal Problem:   Pelvic abscess in female Active Problems:   Fever, puerperal   Endometritis   . acidophilus  2 capsule Oral TID    HPI: [facility] Little is a 37 y.o. female [id number] who had NSVD 11-22-17 , d/c within 24h.  By 3-14 she developed fever to 102 and developed abd pain. She was started on augmentin, and then returned 3-15 with peritoneal signs (increased pain with movement).  She had WBC 14.3 in ED. She was started on cefoxitin. She was not seen to have retained products on u/s. She did have CT scan (3-16) to follow which showed "Persistent enlargement of the uterus with  ill-defined thickened endometrium, which could indicate postpartum endometritis. In addition, there is a small abscess anterior to the uterine body with extensive surrounding inflammatory changes, as above." Clindamycin was added to her regimen and she was planned to be d/c the following day. Unfortunately, she has had worsened pain which prohibited d/c.  She had repeat u/s done today which showed: 9.4 x 4.3 x 3.6 cm mass/abscess anterior to the uterus and a 3.3 x  2.0 x 3.1 cm collection anterior to the r ovary.    Her WBC is now normal, she has been afebrile since admission. She states she has been getting "round the clock tylenol and ibuprofen"  Review of Systems: Review of Systems  Constitutional: Negative for chills and fever.  Respiratory: Negative for cough and shortness of breath.   Gastrointestinal: Positive for abdominal pain. Negative for constipation and diarrhea.  Genitourinary: Negative for dysuria.  having peritoneal pain- pain with flatulence, pain with BM, pain with urination. Feels like a spasm.  Please see HPI. All other systems reviewed and negative.   Past Medical History:  Diagnosis Date  . Acute blood loss anemia 12/17/2013  . Acute blood loss anemia 06/12/2015  . Fibroid    large 10cm  . Pelvic abscess in female 12/05/2017  . Postpartum care following vaginal delivery (3/29) 12/15/2013  .  SVD (spontaneous vaginal delivery) 12/17/2013  . Vaginal Pap smear, abnormal     Social History   Tobacco Use  . Smoking status: Never Smoker  . Smokeless tobacco: Never Used  Substance Use Topics  . Alcohol use: Yes  . Drug use: No    Family History  Problem Relation Age of Onset  . Hypertension Father   . Cancer Mother        breast  . Cancer Maternal Grandfather        pancreatic  . Cancer Paternal Grandmother        lymphoma  . Cancer Paternal Grandfather        prostate     Medications:  Scheduled: . acidophilus  2 capsule Oral TID    Abtx:   Anti-infectives (From admission, onward)   Start     Dose/Rate Route Frequency Ordered Stop   12/02/17 2000  clindamycin (CLEOCIN) IVPB 900 mg     900 mg 100 mL/hr over 30 Minutes Intravenous Every 8 hours 12/02/17 1903     12/02/17 1915  clindamycin (CLEOCIN) IVPB 300 mg  Status:  Discontinued     300 mg 100 mL/hr over 30 Minutes Intravenous Every 6 hours 12/02/17 1902 12/02/17 1903   12/02/17 0200  cefOXitin (MEFOXIN) 2 g in sodium chloride 0.9 % 100 mL IVPB     2 g 200 mL/hr over 30 Minutes Intravenous Every 6 hours 12/02/17 0146          OBJECTIVE: Blood pressure 111/68, pulse 75, temperature 98.2 F (36.8 C), temperature source Oral, resp. rate 18, height 5\' 5"  (1.651 m), weight 66.2 kg (146 lb), last menstrual period 11/21/2017, SpO2 99 %, currently breastfeeding.  Physical Exam  Lab Results Results for orders placed or performed during the hospital encounter of 12/01/17 (from the past 48 hour(s))  CBC     Status: Abnormal   Collection Time: 12/05/17  5:48 AM  Result Value Ref Range   WBC 9.0 4.0 - 10.5 K/uL   RBC 4.35 3.87 - 5.11 MIL/uL   Hemoglobin 10.8 (L) 12.0 - 15.0 g/dL   HCT 33.8 (L) 36.0 - 46.0 %   MCV 77.7 (L) 78.0 - 100.0 fL   MCH 24.8 (L) 26.0 - 34.0 pg   MCHC 32.0 30.0 - 36.0 g/dL   RDW 15.1 11.5 - 15.5 %   Platelets 336 150 - 400 K/uL    Comment: Performed at [facility], [address] [place], [place] [address]   No results found for: SDES, SPECREQUEST, CULT, REPTSTATUS US Pelvic Complete With Transvaginal  Result Date: 12/05/2017 CLINICAL DATA:  Patient with acute pelvic abscess. Lower abdominal pain. EXAM: TRANSABDOMINAL AND TRANSVAGINAL ULTRASOUND OF PELVIS TECHNIQUE: Both transabdominal and transvaginal ultrasound examinations of the pelvis were performed. Transabdominal technique was performed for global imaging of the pelvis including uterus, ovaries, adnexal regions, and pelvic cul-de-sac. It was necessary to proceed with endovaginal exam  following the transabdominal exam to visualize the endometrium and adnexal structures. COMPARISON:  CT abdomen pelvis 12/02/2017; pelvic ultrasound 12/01/2017 FINDINGS: Uterus Measurements: 13.4 x 7.1 x 9.8 cm. Uterus is heterogeneous in echogenicity. No fibroids are visualized. Endometrium Thickness: 15 mm. Endometrium appears thickened with small amount of endometrial fluid. Right ovary Measurements: 2.7 x 1.4 x 1.8 cm. Normal appearance/no adnexal mass. Left ovary Measurements: 3.8 x 1.2 x 1.8 cm. Normal appearance/no adnexal mass. Other findings There is a complex cystic mass within the pelvis superior to the uterus measuring 9.4 x 4.3 x 3.6  cm. There is an additional complex fluid collection inferior to the right ovary measuring 3.3 x 2.0 x 3.1 cm. IMPRESSION: 1. Nonspecific complex fluid collection anterior and superior to the uterus which may represent abscess. Additionally there is a smaller complex fluid collection inferior and posterior to the right ovary. 2. Endometrium is thickened which is nonspecific and may represent endometrial blood products. Postpartum endometritis is a consideration. Hypovascular retained products of conception not excluded. Recommend short-term follow-up pelvic ultrasound. Electronically Signed   By: Lovey Newcomer M.D.   On: 12/05/2017 09:43   No results found for this or any previous visit (from the past 240 hour(s)).  Microbiology: No results found for this or any previous visit (from the past 240 hour(s)).  Radiographs and labs were personally reviewed by me.   Bobby Rumpf, MD [facility] [phone] 12/05/2017, 6:15 PM

## 2017-12-06 ENCOUNTER — Ambulatory Visit (HOSPITAL_COMMUNITY)
Admit: 2017-12-06 | Discharge: 2017-12-06 | Disposition: A | Discharge status: Home / Self Care | Payer: No Typology Code available for payment source | Attending: Obstetrics & Gynecology | Admitting: Obstetrics & Gynecology

## 2017-12-06 ENCOUNTER — Encounter (HOSPITAL_COMMUNITY): Payer: Self-pay

## 2017-12-06 DIAGNOSIS — O8619 Other infection of genital tract following delivery: Secondary | ICD-10-CM | POA: Insufficient documentation

## 2017-12-06 DIAGNOSIS — Z978 Presence of other specified devices: Secondary | ICD-10-CM

## 2017-12-06 DIAGNOSIS — N739 Female pelvic inflammatory disease, unspecified: Secondary | ICD-10-CM

## 2017-12-06 LAB — PROTIME-INR
INR: 0.98
Prothrombin Time: 12.9 seconds (ref 11.4–15.2)

## 2017-12-06 LAB — BASIC METABOLIC PANEL
Anion gap: 9 (ref 5–15)
BUN: 15 mg/dL (ref 6–20)
CO2: 21 mmol/L — ABNORMAL LOW (ref 22–32)
Calcium: 8.7 mg/dL — ABNORMAL LOW (ref 8.9–10.3)
Chloride: 106 mmol/L (ref 101–111)
Creatinine, Ser: 0.76 mg/dL (ref 0.44–1.00)
GFR calc Af Amer: >60 mL/min (ref >60)
GFR calc non Af Amer: >60 mL/min (ref >60)
Glucose, Bld: 86 mg/dL (ref 65–99)
Potassium: 4.4 mmol/L (ref 3.5–5.1)
Sodium: 136 mmol/L (ref 135–145)

## 2017-12-06 LAB — HIV ANTIBODY (ROUTINE TESTING W REFLEX): HIV Screen 4th Generation wRfx: NONREACTIVE

## 2017-12-06 MED ORDER — DOCUSATE SODIUM 100 MG PO CAPS
100.0000 mg | ORAL_CAPSULE | Freq: Two times a day (BID) | ORAL | Status: DC
  Administered 2017-12-06 – 2017-12-08 (×3): 100 mg via ORAL
  Filled 2017-12-06 (×5): qty 1

## 2017-12-06 MED ORDER — SODIUM CHLORIDE 0.9% FLUSH
5.0000 mL | Freq: Three times a day (TID) | INTRAVENOUS | Status: DC
  Administered 2017-12-06: 3 mL
  Administered 2017-12-07: 5 mL
  Administered 2017-12-07: 3 mL
  Administered 2017-12-07: 5 mL
  Administered 2017-12-08: 3 mL

## 2017-12-06 MED ORDER — LORAZEPAM 2 MG/ML IJ SOLN
INTRAMUSCULAR | Status: AC
  Administered 2017-12-06: 0.5 mg via INTRAVENOUS
  Filled 2017-12-06: qty 1

## 2017-12-06 MED ORDER — FENTANYL CITRATE (PF) 100 MCG/2ML IJ SOLN
INTRAMUSCULAR | Status: AC | PRN
  Administered 2017-12-06 (×2): 50 ug via INTRAVENOUS

## 2017-12-06 MED ORDER — FENTANYL CITRATE (PF) 100 MCG/2ML IJ SOLN
INTRAMUSCULAR | Status: AC
  Filled 2017-12-06: qty 4

## 2017-12-06 MED ORDER — MIDAZOLAM HCL 2 MG/2ML IJ SOLN
INTRAMUSCULAR | Status: AC | PRN
  Administered 2017-12-06 (×3): 1 mg via INTRAVENOUS

## 2017-12-06 MED ORDER — MIDAZOLAM HCL 2 MG/2ML IJ SOLN
INTRAMUSCULAR | Status: AC
  Filled 2017-12-06: qty 4

## 2017-12-06 MED ORDER — ACETAMINOPHEN 325 MG PO TABS
650.0000 mg | ORAL_TABLET | Freq: Three times a day (TID) | ORAL | Status: DC | PRN
  Administered 2017-12-06 – 2017-12-08 (×4): 650 mg via ORAL
  Filled 2017-12-06 (×4): qty 2

## 2017-12-06 MED ORDER — LIDOCAINE HCL 1 % IJ SOLN
INTRAMUSCULAR | Status: AC
  Filled 2017-12-06: qty 20

## 2017-12-06 MED ORDER — LORAZEPAM 2 MG/ML IJ SOLN
0.5000 mg | Freq: Once | INTRAMUSCULAR | Status: AC
  Administered 2017-12-06: 0.5 mg via INTRAVENOUS

## 2017-12-06 NOTE — Sedation Documentation (Signed)
Patient is resting comfortably. 

## 2017-12-06 NOTE — Procedures (Signed)
  Procedure: CT LLQ abscess drain  71F EBL:   minimal Complications:  none immediate  See full dictation in [facility].  Dillard Cannon MD Main # [phone] Pager  [phone]

## 2017-12-06 NOTE — Progress Notes (Signed)
INFECTIOUS DISEASE PROGRESS NOTE  ID: [facility] Little is a 37 y.o. female with  Principal Problem:   Pelvic abscess in female Active Problems:   Fever, puerperal   Endometritis  Subjective: Feels better.   Abtx:  Anti-infectives (From admission, onward)   Start     Dose/Rate Route Frequency Ordered Stop   12/02/17 2000  clindamycin (CLEOCIN) IVPB 900 mg     900 mg 100 mL/hr over 30 Minutes Intravenous Every 8 hours 12/02/17 1903     12/02/17 1915  clindamycin (CLEOCIN) IVPB 300 mg  Status:  Discontinued     300 mg 100 mL/hr over 30 Minutes Intravenous Every 6 hours 12/02/17 1902 12/02/17 1903   12/02/17 0200  cefOXitin (MEFOXIN) 2 g in sodium chloride 0.9 % 100 mL IVPB     2 g 200 mL/hr over 30 Minutes Intravenous Every 6 hours 12/02/17 0146        Medications:  Scheduled: . acidophilus  2 capsule Oral TID  . docusate sodium  100 mg Oral BID  . sodium chloride flush  5 mL Intracatheter Q8H    Objective: Vital signs in last 24 hours: Temp:  [98.1 F (36.7 C)-98.5 F (36.9 C)] 98.5 F (36.9 C) (03/20 1621) Pulse Rate:  [61-85] 72 (03/20 1621) Resp:  [11-20] 18 (03/20 1621) BP: (91-112)/(46-79) 96/50 (03/20 1621) SpO2:  [97 %-100 %] 99 % (03/20 1621)   General appearance: alert, cooperative and no distress Resp: clear to auscultation bilaterally Cardio: regular rate and rhythm GI: normal findings: bowel sounds normal, soft, non-tender and drain with clear, yellow fluid.   Lab Results Recent Labs    12/05/17 0548 12/06/17 0603  WBC 9.0  --   HGB 10.8*  --   HCT 33.8*  --   NA  --  136  K  --  4.4  CL  --  106  CO2  --  21*  BUN  --  15  CREATININE  --  0.76   Liver Panel No results for input(s): PROT, ALBUMIN, AST, ALT, ALKPHOS, BILITOT, BILIDIR, IBILI in the last 72 hours. Sedimentation Rate No results for input(s): ESRSEDRATE in the last 72 hours. C-Reactive Protein No results for input(s): CRP in the last 72 hours.  Microbiology: Recent  Results (from the past 240 hour(s))  Aerobic/Anaerobic Culture (surgical/deep wound)     Status: None (Preliminary result)   Collection Time: 12/06/17 12:30 PM  Result Value Ref Range Status   Specimen Description ABSCESS PELVIS  Final   Special Requests NONE  Final   Gram Stain   Final    MODERATE WBC PRESENT, PREDOMINANTLY PMN NO ORGANISMS SEEN Performed at [facility], [address], [place], [place] [address]    Culture PENDING  Incomplete   Report Status PENDING  Incomplete    Studies/Results: US Pelvic Complete With Transvaginal  Result Date: 12/05/2017 CLINICAL DATA:  Patient with acute pelvic abscess. Lower abdominal pain. EXAM: TRANSABDOMINAL AND TRANSVAGINAL ULTRASOUND OF PELVIS TECHNIQUE: Both transabdominal and transvaginal ultrasound examinations of the pelvis were performed. Transabdominal technique was performed for global imaging of the pelvis including uterus, ovaries, adnexal regions, and pelvic cul-de-sac. It was necessary to proceed with endovaginal exam following the transabdominal exam to visualize the endometrium and adnexal structures. COMPARISON:  CT abdomen pelvis 12/02/2017; pelvic ultrasound 12/01/2017 FINDINGS: Uterus Measurements: 13.4 x 7.1 x 9.8 cm. Uterus is heterogeneous in echogenicity. No fibroids are visualized. Endometrium Thickness: 15 mm. Endometrium appears thickened with small amount of  endometrial fluid. Right ovary Measurements: 2.7 x 1.4 x 1.8 cm. Normal appearance/no adnexal mass. Left ovary Measurements: 3.8 x 1.2 x 1.8 cm. Normal appearance/no adnexal mass. Other findings There is a complex cystic mass within the pelvis superior to the uterus measuring 9.4 x 4.3 x 3.6 cm. There is an additional complex fluid collection inferior to the right ovary measuring 3.3 x 2.0 x 3.1 cm. IMPRESSION: 1. Nonspecific complex fluid collection anterior and superior to the uterus which may represent abscess. Additionally there is a smaller complex fluid  collection inferior and posterior to the right ovary. 2. Endometrium is thickened which is nonspecific and may represent endometrial blood products. Postpartum endometritis is a consideration. Hypovascular retained products of conception not excluded. Recommend short-term follow-up pelvic ultrasound. Electronically Signed   By: Lovey Newcomer M.D.   On: 12/05/2017 09:43     Assessment/Plan: Post-partum Pelvic Abscess Endometrititis Anemia, microcytic   Total days of antibiotics: 4 clinda/cefoxitin  She feels much better after drain placed.  Gram stain does not show bacteria, will rely on Cx.  If she is sent home prior to Cx result, would favor invanz via midline or pic.  follow drain output.  Await f/u imaging to determine length of therapy. Defer to IR  Family, pt updated.   I spent a total of 25 minutes with this patient. Greater than 50% of this time was spent counseling and coordinating care for this patient.           Bobby Rumpf MD, FACP Infectious Diseases (pager) [phone] [email or web address] 12/06/2017, 4:43 PM  LOS: 4 days

## 2017-12-06 NOTE — Progress Notes (Signed)
Subjective: Feels better except for pelvic pain, that is not resolving, now HD # 6, needs Percocet. Also back pain.  No n/v/fever/ chills/ sweats/ diarrhea. Wants stool sofner.  She had ID consult yesterday with Dr Johnnye Sima.   Objective: Vital signs in last 24 hours: Temp:  [98.2 F (36.8 C)-98.5 F (36.9 C)] 98.5 F (36.9 C) (03/20 0850) Pulse Rate:  [61-85] 85 (03/20 0850) Resp:  [17-18] 18 (03/20 0850) BP: (91-112)/(46-74) 112/74 (03/20 0850) SpO2:  [98 %-100 %] 100 % (03/20 0850) Weight change:  Last BM Date: 12/04/17 Afebrile General appearance: alert and cooperative Breasts wnl, pumping well  GI: soft, lower abdomen slight tenderness. Normal active bowel sounds with no peritoneal signs Extremities: no calf tenderness./swelling  Pelvic deferred   CBC Latest Ref Rng & Units 12/05/2017 12/03/2017 12/01/2017  WBC 4.0 - 10.5 K/uL 9.0 10.1 14.3(H)  Hemoglobin 12.0 - 15.0 g/dL 10.8(L) 10.6(L) 10.6(L)  Hematocrit 36.0 - 46.0 % 33.8(L) 33.1(L) 33.1(L)  Platelets 150 - 400 K/uL 336 310 319    BMP Latest Ref Rng & Units 12/06/2017  Glucose 65 - 99 mg/dL 86  BUN 6 - 20 mg/dL 15  Creatinine 0.44 - 1.00 mg/dL 0.76  Sodium 135 - 145 mmol/L 136  Potassium 3.5 - 5.1 mmol/L 4.4  Chloride 101 - 111 mmol/L 106  CO2 22 - 32 mmol/L 21(L)  Calcium 8.9 - 10.3 mg/dL 8.7(L)   Studies/Results: Ct Abdomen Pelvis W Contrast 12/02/2017 Postpartum uterus appears enlarged measuring 10.4 x 13.5 x 7.7 cm, with persistent ill-defined thickening of the endometrium which measures up to 16 mm. Immediately anterior to the uterus there is a multilocular fluid collection which demonstrates some peripheral enhancement (coronal image 43 of series 602 and sagittal image 85 of series 603) measuring approximately 2.1 x 9.8 x 2.7 cm, with adjacent inflammatory changes in the surrounding fat, concerning for a small abscess. Ovaries are unremarkable in appearance. Other: No significant volume of ascites.  No  pneumoperitoneum. Musculoskeletal: There are no aggressive appearing lytic or blastic lesions noted in the visualized portions of the skeleton.  IMPRESSION: 1. Persistent enlargement of the uterus with ill-defined thickened endometrium, which could indicate postpartum endometritis. In addition, there is a small abscess anterior to the uterine body with extensive surrounding inflammatory changes, as above. 2. Normal appendix. 3. Nonobstructive calculi in the collecting systems of both kidneys measuring up to 7 mm on the right side.   US Pelvis 12/05/2017:  There is a complex cystic mass within the pelvis superior to the uterus measuring 9.4 x 4.3 x 3.6 cm. There is an additional complex fluid collection inferior to the right ovary measuring 3.3 x 2.0 x 3.1 cm.  IMPRESSION: 1. Nonspecific complex fluid collection anterior and superior to the uterus which may represent abscess. Additionally there is a smaller complex fluid collection inferior and posterior to the right ovary. 2. Endometrium is thickened which is nonspecific and may represent endometrial blood products. Postpartum endometritis is a consideration. Hypovascular retained products of conception not excluded. Recommend short-term follow-up pelvic ultrasound  Medications:  - CefoXitin (MEFOXIN) 2 g every 6 hrs (since 3/15 night, day 6 from this evening) and Clindamycin 900 mg IV q 8 hrs (3/16 Noon, day 5 from noon) - Probiotic  - Percocet prn  Assessment/Plan: HD # 6 Postpartum day 14, low risk pregnancy/ vaginal birth. Endometritis (at postpartum 7), Anterior pelvic abscess, on double antibiotics (Cefoxitin, Clindamycin) since 3/16 noon.  CBC, fever resolved, but abscess size increased and pain not  resolved.  CT guided IR drainage/ possible drain today at 11 am.  S/p ID consult, appreciated. Will assess gram stain and cultures and plan PO vs IV antibiotics for 10-14 more days (will reassess if home with PICC needed).  Patient open to  switching antibiotics that may not allow breast feeding if needed.   Sydney Creasy, MD Cell: [phone]

## 2017-12-06 NOTE — H&P (Signed)
Chief Complaint: Pelvic abscess  Referring Physician(s): Azucena Fallen  Supervising Physician: Arne Cleveland  Patient Status: [facility] In patient  History of Present Illness: Sydney Little is a 37 y.o. female who recently delivered a term baby on 11/22/17.  She did well post-partum and was discharged at 24 hours on 3/7.   On 3/14 she developed lower abdominal pain and fever of 102.6.  She called her OB/GYN office with symptoms and was started on Augmentin for presumed endometritis.  She presented to Maternity Admissions at [facility] on 12/01/2017.  CT scan was done on 12/02/17 which showed= Postpartum uterus appears enlarged measuring 10.4 x 13.5 x 7.7 cm, with persistent ill-defined thickening of the endometrium which measures up to 16 mm. Immediately anterior to the uterus there is a multilocular fluid collection which demonstrates some peripheral enhancement (coronal image 43 of series 602 and sagittal image 85 of series 603) measuring approximately 2.1 x 9.8 x 2.7 cm, with adjacent inflammatory changes in the surrounding fat, concerning for a small abscess. Ovaries are unremarkable in Appearance.  We are asked to evaluate for aspiration/drain placement.   Past Medical History:  Diagnosis Date  . Acute blood loss anemia 12/17/2013  . Acute blood loss anemia 06/12/2015  . Fibroid    large 10cm  . Pelvic abscess in female 12/05/2017  . Postpartum care following vaginal delivery (3/29) 12/15/2013  . SVD (spontaneous vaginal delivery) 12/17/2013  . Vaginal Pap smear, abnormal     Past Surgical History:  Procedure Laterality Date  . DILATION AND CURETTAGE OF UTERUS     2001/2004  . WISDOM TOOTH EXTRACTION      Allergies: Patient has no known allergies.  Medications: Prior to Admission medications   Medication Sig Start Date End Date Taking? Authorizing Provider  acetaminophen (TYLENOL) 500 MG tablet Take 500 mg by mouth every 6 (six) hours as  needed for mild pain or headache.    Yes [provider]  amoxicillin-clavulanate (AUGMENTIN) 875-125 MG tablet Take 1 tablet by mouth 2 (two) times daily.   Yes [provider]  ibuprofen (ADVIL,MOTRIN) 600 MG tablet Take 1 tablet (600 mg total) by mouth every 6 (six) hours. 11/23/17  Yes Sigmon, Meredith C, CNM  magnesium oxide (MAG-OX) 400 (241.3 MG) MG tablet Take 0.5 tablets (200 mg total) by mouth daily. 06/12/15  Yes Laury Deep, CNM  Prenatal Vit-Fe Fumarate-FA (MULTIVITAMIN-PRENATAL) 27-0.8 MG TABS tablet Take 1 tablet by mouth daily at 12 noon.   Yes [provider]     Family History  Problem Relation Age of Onset  . Hypertension Father   . Cancer Mother        breast  . Cancer Maternal Grandfather        pancreatic  . Cancer Paternal Grandmother        lymphoma  . Cancer Paternal Grandfather        prostate    Social History   Socioeconomic History  . Marital status: Married    Spouse name: None  . Number of children: None  . Years of education: None  . Highest education level: None  Social Needs  . Financial resource strain: None  . Food insecurity - worry: None  . Food insecurity - inability: None  . Transportation needs - medical: None  . Transportation needs - non-medical: None  Occupational History  . None  Tobacco Use  . Smoking status: Never Smoker  . Smokeless tobacco: Never Used  Substance and Sexual Activity  .  Alcohol use: Yes  . Drug use: No  . Sexual activity: Yes    Birth control/protection: None  Other Topics Concern  . None  Social History Narrative  . None    Review of Systems: A 12 point ROS discussedReview of Systems  Constitutional: Positive for activity change and fever.  HENT: Negative.   Respiratory: Negative.   Cardiovascular: Negative.   Gastrointestinal: Positive for abdominal pain.  Genitourinary: Negative.   Musculoskeletal: Negative.     Vital Signs: LMP 11/21/2017 (Exact Date) Comment:  delivered  Physical Exam  Constitutional: She is oriented to person, place, and time. She appears well-developed.  HENT:  Head: Normocephalic and atraumatic.  Eyes: EOM are normal.  Neck: Normal range of motion.  Cardiovascular: Normal rate, regular rhythm and normal heart sounds.  Pulmonary/Chest: Effort normal and breath sounds normal. No stridor. No respiratory distress.  Abdominal: There is tenderness.  Musculoskeletal: Normal range of motion.  Neurological: She is alert and oriented to person, place, and time.  Skin: Skin is warm and dry.  Psychiatric: She has a normal mood and affect. Her behavior is normal. Judgment and thought content normal.  Vitals reviewed.   Imaging: US Pelvis (transabdominal Only)  Result Date: 12/02/2017 CLINICAL DATA:  Postpartum pelvic pain and fever. EXAM: TRANSABDOMINAL ULTRASOUND OF PELVIS TECHNIQUE: Transabdominal ultrasound examination of the pelvis was performed including evaluation of the uterus, ovaries, adnexal regions, and pelvic cul-de-sac. COMPARISON:  None. FINDINGS: Uterus Measurements: 13.8 x 8.1 x 10.9 cm. Intramural fibroid at the left uterine fundus, measuring 2.1 x 1.4 x 2.5 cm. Endometrium Small amount of fluid in the endometrial cavity. Endometrium is not clearly measurable due to postpartum status. Right ovary Not visualized Left ovary Measurements: 3.7 x 2.0 x 2.8 cm. Normal appearance/no adnexal mass. Other findings:  Trace free fluid in the posterior cul-de-sac. IMPRESSION: 1. No acute abnormality of the uterus or endometrial cavity. 2. Small left fundal uterine fibroid. 3. Nonvisualization of the right ovary. Electronically Signed   By: Ulyses Jarred M.D.   On: 12/02/2017 00:19   Ct Abdomen Pelvis W Contrast  Result Date: 12/02/2017 CLINICAL DATA:  37 year old female with history of right-sided flank and lower pelvic pain with fever to 102 degrees. Patient is recently postpartum with history of spontaneous vaginal delivery on  11/22/2017. EXAM: CT ABDOMEN AND PELVIS WITH CONTRAST TECHNIQUE: Multidetector CT imaging of the abdomen and pelvis was performed using the standard protocol following bolus administration of intravenous contrast. CONTRAST:  133mL ISOVUE-300 IOPAMIDOL (ISOVUE-300) INJECTION 61% COMPARISON:  No priors. FINDINGS: Lower chest: Unremarkable. Hepatobiliary: No cystic or solid hepatic lesions. No intra or extrahepatic biliary ductal dilatation. Gallbladder is normal in appearance. Pancreas: No pancreatic mass. No pancreatic ductal dilatation. No pancreatic or peripancreatic fluid or inflammatory changes. Spleen: Unremarkable. Adrenals/Urinary Tract: Several nonobstructive calculi are noted within the collecting systems of both kidneys, largest of which measures up to 7 mm in the interpolar region of the right kidney, potentially within a calyceal diverticulum. No suspicious renal lesions. No hydroureteronephrosis. Urinary bladder is normal in appearance. Stomach/Bowel: Normal appearance of the stomach. No pathologic dilatation of small bowel or colon. Normal appendix. Vascular/Lymphatic: No atherosclerotic disease, aneurysm or dissection noted in the abdominal or pelvic vasculature. No lymphadenopathy noted in the abdomen or pelvis. Reproductive: Postpartum uterus appears enlarged measuring 10.4 x 13.5 x 7.7 cm, with persistent ill-defined thickening of the endometrium which measures up to 16 mm. Immediately anterior to the uterus there is a multilocular fluid collection which demonstrates some  peripheral enhancement (coronal image 43 of series 602 and sagittal image 85 of series 603) measuring approximately 2.1 x 9.8 x 2.7 cm, with adjacent inflammatory changes in the surrounding fat, concerning for a small abscess. Ovaries are unremarkable in appearance. Other: No significant volume of ascites.  No pneumoperitoneum. Musculoskeletal: There are no aggressive appearing lytic or blastic lesions noted in the visualized  portions of the skeleton. IMPRESSION: 1. Persistent enlargement of the uterus with ill-defined thickened endometrium, which could indicate postpartum endometritis. In addition, there is a small abscess anterior to the uterine body with extensive surrounding inflammatory changes, as above. 2. Normal appendix. 3. Nonobstructive calculi in the collecting systems of both kidneys measuring up to 7 mm on the right side. Electronically Signed   By: Vinnie Langton M.D.   On: 12/02/2017 18:13   US Pelvic Complete With Transvaginal  Result Date: 12/05/2017 CLINICAL DATA:  Patient with acute pelvic abscess. Lower abdominal pain. EXAM: TRANSABDOMINAL AND TRANSVAGINAL ULTRASOUND OF PELVIS TECHNIQUE: Both transabdominal and transvaginal ultrasound examinations of the pelvis were performed. Transabdominal technique was performed for global imaging of the pelvis including uterus, ovaries, adnexal regions, and pelvic cul-de-sac. It was necessary to proceed with endovaginal exam following the transabdominal exam to visualize the endometrium and adnexal structures. COMPARISON:  CT abdomen pelvis 12/02/2017; pelvic ultrasound 12/01/2017 FINDINGS: Uterus Measurements: 13.4 x 7.1 x 9.8 cm. Uterus is heterogeneous in echogenicity. No fibroids are visualized. Endometrium Thickness: 15 mm. Endometrium appears thickened with small amount of endometrial fluid. Right ovary Measurements: 2.7 x 1.4 x 1.8 cm. Normal appearance/no adnexal mass. Left ovary Measurements: 3.8 x 1.2 x 1.8 cm. Normal appearance/no adnexal mass. Other findings There is a complex cystic mass within the pelvis superior to the uterus measuring 9.4 x 4.3 x 3.6 cm. There is an additional complex fluid collection inferior to the right ovary measuring 3.3 x 2.0 x 3.1 cm. IMPRESSION: 1. Nonspecific complex fluid collection anterior and superior to the uterus which may represent abscess. Additionally there is a smaller complex fluid collection inferior and posterior to the  right ovary. 2. Endometrium is thickened which is nonspecific and may represent endometrial blood products. Postpartum endometritis is a consideration. Hypovascular retained products of conception not excluded. Recommend short-term follow-up pelvic ultrasound. Electronically Signed   By: Lovey Newcomer M.D.   On: 12/05/2017 09:43    Labs:  CBC: Recent Labs    11/23/17 0518 12/01/17 2227 12/03/17 0547 12/05/17 0548  WBC 13.0* 14.3* 10.1 9.0  HGB 9.5* 10.6* 10.6* 10.8*  HCT 29.1* 33.1* 33.1* 33.8*  PLT 164 319 310 336    COAGS: Recent Labs    12/06/17 0603  INR 0.98    BMP: Recent Labs    12/06/17 0603  NA 136  K 4.4  CL 106  CO2 21*  GLUCOSE 86  BUN 15  CALCIUM 8.7*  CREATININE 0.76  GFRNONAA >60  GFRAA >60    LIVER FUNCTION TESTS: No results for input(s): BILITOT, AST, ALT, ALKPHOS, PROT, ALBUMIN in the last 8760 hours.  TUMOR MARKERS: No results for input(s): AFPTM, CEA, CA199, CHROMGRNA in the last 8760 hours.  Assessment and Plan:  Recent vaginal delivery, now with pelvic abscess.  Will proceed with image guided aspiration/drain placement by Dr. Vernard Gambles  Risks and benefits discussed with the patient including bleeding, infection, damage to adjacent structures, bowel perforation/fistula connection, and sepsis.  All of the patient's questions were answered, patient is agreeable to proceed. Consent signed and in chart.  Thank you  for this interesting consult.  I greatly enjoyed meeting Sydney Little and look forward to participating in their care.  A copy of this report was sent to the requesting provider on this date.  Electronically Signed: Murrell Redden, PA-C 12/06/2017, 10:34 AM   I spent a total of 40 Minutes in face to face in clinical consultation, greater than 50% of which was counseling/coordinating care for drain placement.

## 2017-12-06 NOTE — Progress Notes (Signed)
[facility] Infusion Coordinator will follow Mrs. O'Berry with Dr. Johnnye Sima to support home IV ABX at DC if ordered based on cultures and pt progression.    Please call [phone] Monday- Friday 8A-5P.  If patient discharges after hours, please call [phone].   Larry Sierras 12/06/2017, 7:35 AM

## 2017-12-06 NOTE — Progress Notes (Signed)
Patient ID: Sydney Little, female   DOB: [birth year], 37 y.o.   MRN: [id number] Feels better since drain placed.  Reviewed ID recommendations- either home with [facility] tomorrow and IV Invanz or await culture (Fri afternoon) and then discharge with either PO or IV abx based on more information.  Reviewed risks of PIC line incl infection/ thrombophlebitis/ emboli.  Pt's husband in room as well.  After counseling- plan is to set up [facility] for Fri afternoon and D/c planner to start working on home health and abx on Fri morning in case culture supports PIC and home IV Invanz COntinue IV Cefoxitin/ Clindamycin here   V.Dominion Kathan, MD

## 2017-12-07 DIAGNOSIS — O864 Pyrexia of unknown origin following delivery: Secondary | ICD-10-CM

## 2017-12-07 LAB — URINALYSIS, ROUTINE W REFLEX MICROSCOPIC
Bacteria, UA: NONE SEEN
Bilirubin Urine: NEGATIVE
Glucose, UA: NEGATIVE mg/dL
Ketones, ur: NEGATIVE mg/dL
Nitrite: POSITIVE — AB
Protein, ur: NEGATIVE mg/dL
Specific Gravity, Urine: 1.01 (ref 1.005–1.030)
pH: 7 (ref 5.0–8.0)

## 2017-12-07 MED ORDER — PHENAZOPYRIDINE HCL 100 MG PO TABS
100.0000 mg | ORAL_TABLET | Freq: Three times a day (TID) | ORAL | Status: DC
  Administered 2017-12-07 – 2017-12-08 (×5): 100 mg via ORAL
  Filled 2017-12-07 (×8): qty 1

## 2017-12-07 NOTE — Progress Notes (Signed)
[facility]  Met with pt to discuss home care/home infusion services and demonstrated administration of IV ABX for home if needed to be prepared for same.  Pt very receptive and voiced understanding of [facility] protocol and procedure for IV ABX.  Pt has good support at home. [facility] will be available for DC when OB and ID teams deem pt is ready.  If patient discharges after hours, please call [phone].   Larry Sierras 12/07/2017, 5:40 PM

## 2017-12-07 NOTE — Care Management (Signed)
CM met with patient regarding potential discharge needs in room.  Patient awaiting cultures.  Per patient she may discharge home tomorrow with PICC line and IV antx.  CM offered HH choice and list to patient.  Patient chose [facility].  Referral sent to Pam Chandler #[phone] with [facility].  Demographics reviewed and correct in Epic.  CM will continue to follow and assist needs.  Awaiting cultures to see if patient needs PICC line and IV Home Health. Please call: [phone] for questions/ needs 

## 2017-12-07 NOTE — Progress Notes (Signed)
Called to [facility] and spoke with RN and patient regarding drain.  Afebrile.  No CBC today. Reports feeling better.  Patient has had 25 mL of serous output overnight.  Skin stitch is slightly irritating, but otherwise no complaints.  Discussed discharge instructions.   Brynda Greathouse, MS RD PA-C 2:34 PM

## 2017-12-07 NOTE — Progress Notes (Signed)
INFECTIOUS DISEASE PROGRESS NOTE  ID: Sydney Little is a 37 y.o. female with  Principal Problem:   Pelvic abscess in female Active Problems:   Fever, puerperal   Endometritis  Subjective: Up ambulating in room.  Denies pain except at insertion site of drain.   Abtx:  Anti-infectives (From admission, onward)   Start     Dose/Rate Route Frequency Ordered Stop   12/02/17 2000  clindamycin (CLEOCIN) IVPB 900 mg     900 mg 100 mL/hr over 30 Minutes Intravenous Every 8 hours 12/02/17 1903     12/02/17 1915  clindamycin (CLEOCIN) IVPB 300 mg  Status:  Discontinued     300 mg 100 mL/hr over 30 Minutes Intravenous Every 6 hours 12/02/17 1902 12/02/17 1903   12/02/17 0200  cefOXitin (MEFOXIN) 2 g in sodium chloride 0.9 % 100 mL IVPB     2 g 200 mL/hr over 30 Minutes Intravenous Every 6 hours 12/02/17 0146        Medications:  Scheduled: . acidophilus  2 capsule Oral TID  . docusate sodium  100 mg Oral BID  . phenazopyridine  100 mg Oral TID WC  . sodium chloride flush  5 mL Intracatheter Q8H    Objective: Vital signs in last 24 hours: Temp:  [98.1 F (36.7 C)-98.5 F (36.9 C)] 98.4 F (36.9 C) (03/21 0456) Pulse Rate:  [67-85] 78 (03/21 0456) Resp:  [11-20] 18 (03/21 0456) BP: (96-112)/(50-79) 103/56 (03/21 0456) SpO2:  [97 %-100 %] 98 % (03/21 0456)   General appearance: alert, cooperative and no distress Resp: clear to auscultation bilaterally Cardio: regular rate and rhythm GI: normal findings: bowel sounds normal and soft, non-tender  Lab Results Recent Labs    12/05/17 0548 12/06/17 0603  WBC 9.0  --   HGB 10.8*  --   HCT 33.8*  --   NA  --  136  K  --  4.4  CL  --  106  CO2  --  21*  BUN  --  15  CREATININE  --  0.76   Liver Panel No results for input(s): PROT, ALBUMIN, AST, ALT, ALKPHOS, BILITOT, BILIDIR, IBILI in the last 72 hours. Sedimentation Rate No results for input(s): ESRSEDRATE in the last 72 hours. C-Reactive Protein No results for  input(s): CRP in the last 72 hours.  Microbiology: Recent Results (from the past 240 hour(s))  Aerobic/Anaerobic Culture (surgical/deep wound)     Status: None (Preliminary result)   Collection Time: 12/06/17 12:30 PM  Result Value Ref Range Status   Specimen Description ABSCESS PELVIS  Final   Special Requests NONE  Final   Gram Stain   Final    MODERATE WBC PRESENT, PREDOMINANTLY PMN NO ORGANISMS SEEN Performed at [facility], [address] [place], [place] [address]    Culture PENDING  Incomplete   Report Status PENDING  Incomplete    Studies/Results: US Pelvic Complete With Transvaginal  Result Date: 12/05/2017 CLINICAL DATA:  Patient with acute pelvic abscess. Lower abdominal pain. EXAM: TRANSABDOMINAL AND TRANSVAGINAL ULTRASOUND OF PELVIS TECHNIQUE: Both transabdominal and transvaginal ultrasound examinations of the pelvis were performed. Transabdominal technique was performed for global imaging of the pelvis including uterus, ovaries, adnexal regions, and pelvic cul-de-sac. It was necessary to proceed with endovaginal exam following the transabdominal exam to visualize the endometrium and adnexal structures. COMPARISON:  CT abdomen pelvis 12/02/2017; pelvic ultrasound 12/01/2017 FINDINGS: Uterus Measurements: 13.4 x 7.1 x 9.8 cm. Uterus is heterogeneous in echogenicity. No  fibroids are visualized. Endometrium Thickness: 15 mm. Endometrium appears thickened with small amount of endometrial fluid. Right ovary Measurements: 2.7 x 1.4 x 1.8 cm. Normal appearance/no adnexal mass. Left ovary Measurements: 3.8 x 1.2 x 1.8 cm. Normal appearance/no adnexal mass. Other findings There is a complex cystic mass within the pelvis superior to the uterus measuring 9.4 x 4.3 x 3.6 cm. There is an additional complex fluid collection inferior to the right ovary measuring 3.3 x 2.0 x 3.1 cm. IMPRESSION: 1. Nonspecific complex fluid collection anterior and superior to the uterus which may represent  abscess. Additionally there is a smaller complex fluid collection inferior and posterior to the right ovary. 2. Endometrium is thickened which is nonspecific and may represent endometrial blood products. Postpartum endometritis is a consideration. Hypovascular retained products of conception not excluded. Recommend short-term follow-up pelvic ultrasound. Electronically Signed   By: Lovey Newcomer M.D.   On: 12/05/2017 09:43     Assessment/Plan: Post-partum Pelvic Abscess Endometrititis Anemia, microcytic   Total days of antibiotics: 5 clinda/cefoxitin  Continues to feel well Spoke with pharmacy- invanz would be safe, ok for breast feeding.   Spoke with lab- Cx has not grown yet.   follow drain output. 70 cc yesterday.  Await f/u imaging to determine length of therapy.            Bobby Rumpf MD, FACP Infectious Diseases (pager) [phone] [email or web address] 12/07/2017, 8:44 AM  LOS: 5 days

## 2017-12-08 ENCOUNTER — Inpatient Hospital Stay (HOSPITAL_COMMUNITY): Payer: No Typology Code available for payment source

## 2017-12-08 DIAGNOSIS — Z95828 Presence of other vascular implants and grafts: Secondary | ICD-10-CM

## 2017-12-08 DIAGNOSIS — R300 Dysuria: Secondary | ICD-10-CM

## 2017-12-08 LAB — URINE CULTURE: Culture: NO GROWTH

## 2017-12-08 MED ORDER — SODIUM CHLORIDE 0.9% FLUSH: 10.0000 mL | Freq: Two times a day (BID) | INTRAVENOUS | Status: DC

## 2017-12-08 MED ORDER — ERTAPENEM IV (FOR PTA / DISCHARGE USE ONLY): 1.0000 g | INTRAVENOUS | 0 refills | Status: AC

## 2017-12-08 MED ORDER — SODIUM CHLORIDE 0.9 % IV SOLN
1.0000 g | INTRAVENOUS | Status: DC
  Administered 2017-12-08: 1 g via INTRAVENOUS
  Filled 2017-12-08: qty 1

## 2017-12-08 MED ORDER — SODIUM CHLORIDE 0.9% FLUSH: 10.0000 mL | INTRAVENOUS | Status: DC | PRN

## 2017-12-08 MED ORDER — OXYCODONE-ACETAMINOPHEN 5-325 MG PO TABS: 1.0000 | ORAL_TABLET | Freq: Four times a day (QID) | ORAL | 0 refills | Status: DC | PRN

## 2017-12-08 NOTE — Progress Notes (Signed)
PHARMACY CONSULT NOTE FOR:  OUTPATIENT  PARENTERAL ANTIBIOTIC THERAPY (OPAT)  Indication: Pelvic abscess Regimen:  Ertapenem (Invanz) 1 gram IV Q24hr x 14 days End date: 12/22/17  IV antibiotic discharge orders are pended. To discharging provider:  please sign these orders via discharge navigator,  Select New Orders & click on the button choice - Manage This Unsigned Work.     Thank you for allowing pharmacy to be a part of this patient's care.  Beryle Lathe 12/08/2017, 10:02 AM

## 2017-12-08 NOTE — Progress Notes (Signed)
Patient stated dressing was changed late last night and she does not wish to change it or have it changed before she is discharged to home today. Drain sponges and 2x2s provided to patient.

## 2017-12-08 NOTE — Progress Notes (Signed)
Peripherally Inserted Central Catheter/Midline Placement  The IV Nurse has discussed with the patient and/or persons authorized to consent for the patient, the purpose of this procedure and the potential benefits and risks involved with this procedure.  The benefits include less needle sticks, lab draws from the catheter, and the patient may be discharged home with the catheter. Risks include, but not limited to, infection, bleeding, blood clot (thrombus formation), and puncture of an artery; nerve damage and irregular heartbeat and possibility to perform a PICC exchange if needed/ordered by physician.  Alternatives to this procedure were also discussed.  Bard Power PICC patient education guide, fact sheet on infection prevention and patient information card has been provided to patient /or left at bedside.    PICC/Midline Placement Documentation  PICC Single Lumen 93/90/30 PICC Right Basilic 37 cm 0 cm (Active)  Indication for Insertion or Continuance of Line Home intravenous therapies (PICC only) 12/08/2017  4:00 PM  Exposed Catheter (cm) 0 cm 12/08/2017  4:00 PM  Site Assessment Clean;Dry;Intact 12/08/2017  4:00 PM  Line Status Flushed;Blood return noted 12/08/2017  4:00 PM  Dressing Type Transparent 12/08/2017  4:00 PM  Dressing Status Clean;Dry;Intact;Antimicrobial disc in place 12/08/2017  4:00 PM  Dressing Intervention New dressing 12/08/2017  4:00 PM  Dressing Change Due 12/15/17 12/08/2017  4:00 PM       Sydney Little 12/08/2017, 4:21 PM

## 2017-12-08 NOTE — Care Management Note (Signed)
Case Management Note  Patient Details  Name: Janara Klett MRN: [id number] Date of Birth: Jan 28, 1981  Subjective/Objective:                 Post-partum Pelvic Abscess     Action/Plan: 2 week IV antx via PICC line; [facility]   Expected Discharge Date:     12/08/17             Expected Discharge Plan:  Home/Self Care   Discharge planning Services  CM Consult  Post Acute Care Choice:  Home Health Choice offered to:  Patient  DME Arranged:  3-N-1, IV pump/equipment DME Agency:  [place] Arranged:  RN [facility] Agency:  [place]  Status of Service:  Completed, signed off    Additional Comments: CM spoke to Dr. Benjie Karvonen today and Dr. Johnnye Sima. Plan is to discharge today with IV antibiotics for 2 weeks and will be infused in PICC line at home.  Home Health Agency is [place].  Orders placed in Epic.  [facility] to manage Picc line/IV antx and also will change abdominal dressing where drain is that IR placed.  ( see orders).  CM spoke to [employer]. With [facility] # [phone] and referral has been set up and will services will start tomorrow Saturday 12/09/17 in the patient's home. Patient given phone number of [facility] and CM number.  Yong Channel, RN 12/08/2017, 1:41 PM

## 2017-12-08 NOTE — Progress Notes (Signed)
Linda at IV Team called to confirm order and reason for PICC. PICC to be placed today.

## 2017-12-08 NOTE — Progress Notes (Addendum)
INFECTIOUS DISEASE PROGRESS NOTE  ID: Sydney Little is a 37 y.o. female with  Principal Problem:   Pelvic abscess in female Active Problems:   Fever, puerperal   Endometritis  Subjective: I think I have a urinary tract infection.  C/o dysuria.   Abtx:  Anti-infectives (From admission, onward)   Start     Dose/Rate Route Frequency Ordered Stop   12/02/17 2000  clindamycin (CLEOCIN) IVPB 900 mg     900 mg 100 mL/hr over 30 Minutes Intravenous Every 8 hours 12/02/17 1903     12/02/17 1915  clindamycin (CLEOCIN) IVPB 300 mg  Status:  Discontinued     300 mg 100 mL/hr over 30 Minutes Intravenous Every 6 hours 12/02/17 1902 12/02/17 1903   12/02/17 0200  cefOXitin (MEFOXIN) 2 g in sodium chloride 0.9 % 100 mL IVPB     2 g 200 mL/hr over 30 Minutes Intravenous Every 6 hours 12/02/17 0146        Medications:  Scheduled: . acidophilus  2 capsule Oral TID  . docusate sodium  100 mg Oral BID  . phenazopyridine  100 mg Oral TID WC  . sodium chloride flush  5 mL Intracatheter Q8H    Objective: Vital signs in last 24 hours: Temp:  [97.9 F (36.6 C)-99.7 F (37.6 C)] 98.7 F (37.1 C) (03/22 0244) Pulse Rate:  [64-80] 64 (03/22 0244) Resp:  [18] 18 (03/22 0016) BP: (99-119)/(58-73) 99/59 (03/22 0244) SpO2:  [97 %-100 %] 99 % (03/22 0244)   General appearance: alert, cooperative and no distress Resp: clear to auscultation bilaterally Cardio: regular rate and rhythm GI: normal findings: bowel sounds normal and soft, non-tender Extremities: no edema. RUE peripheral IV clean.   Lab Results Recent Labs    12/06/17 0603  NA 136  K 4.4  CL 106  CO2 21*  BUN 15  CREATININE 0.76   Liver Panel No results for input(s): PROT, ALBUMIN, AST, ALT, ALKPHOS, BILITOT, BILIDIR, IBILI in the last 72 hours. Sedimentation Rate No results for input(s): ESRSEDRATE in the last 72 hours. C-Reactive Protein No results for input(s): CRP in the last 72 hours.  Microbiology: Recent  Results (from the past 240 hour(s))  Aerobic/Anaerobic Culture (surgical/deep wound)     Status: None (Preliminary result)   Collection Time: 12/06/17 12:30 PM  Result Value Ref Range Status   Specimen Description ABSCESS PELVIS  Final   Special Requests NONE  Final   Gram Stain   Final    MODERATE WBC PRESENT, PREDOMINANTLY PMN NO ORGANISMS SEEN    Culture   Final    NO GROWTH < 24 HOURS Performed at [facility], [address], [place], [place] [address]    Report Status PENDING  Incomplete    Studies/Results: Ct Image Guided Fluid Drain By Catheter  Result Date: 12/07/2017 CLINICAL DATA:  Recently postpartum. Abdominal pain. CT demonstrates loculated fluid collection anterior to the uterus. EXAM: CT GUIDED DRAINAGE OF PELVIC ABSCESS ANESTHESIA/SEDATION: Intravenous Fentanyl and Versed were administered as conscious sedation during continuous monitoring of the patient's level of consciousness and physiological / cardiorespiratory status by the radiology RN, with a total moderate sedation time of 17 minutes. PROCEDURE: The procedure, risks, benefits, and alternatives were explained to the patient. Questions regarding the procedure were encouraged and answered. The patient understands and consents to the procedure. Select axial scans through the pelvis performed. The collection was localized and an appropriate skin entry site was determined and marked. The operative field  was prepped with chlorhexidinein a sterile fashion, and a sterile drape was applied covering the operative field. A sterile gown and sterile gloves were used for the procedure. Local anesthesia was provided with 1% Lidocaine. Under CT fluoroscopic guidance, a 19 gauge percutaneous entry needle was advanced into the collection. Clear yellow fluid could be aspirated. An Amplatz wire advanced easily within the collection, position confirmed on CT. Tract dilated to facilitate placement of a 10 French pigtail drain catheter,  formed centrally within the collection. A sample of the aspirate sent for Gram stain and culture. The catheter was secured externally with 0 Prolene suture and StatLock and placed to gravity drain bag. The patient tolerated the procedure well. COMPLICATIONS: None immediate FINDINGS: The loculated fluid collection anterior to the uterus was again localized. 10 French drain catheter placed as above IMPRESSION: 1. Technically successful pelvic abscess drain catheter placement. Electronically Signed   By: Lucrezia Europe M.D.   On: 12/07/2017 11:30     Assessment/Plan: Post-partum Pelvic Abscess Endometrititis Anemia, microcytic   Total days of antibiotics:6 clinda/cefoxitin  Drain out 49cc yesterday I spoke with lab her Cx is still no growth.  Would plan for her to go home with [facility] and line and 2 weeks of [employer] Case mgr aware Await her UCx, she does have +nitrate/LE. 0-5 WBC though....  I will see her in clinic in 2 weeks Will defer to ob and IR to arrange f/u imaging and drain removal.          Bobby Rumpf MD, FACP Infectious Diseases (pager) [phone] [email or web address] 12/08/2017, 8:22 AM  LOS: 6 days

## 2017-12-08 NOTE — Progress Notes (Signed)
Subjective: Not feeling too well, UTI s/s. Pyridium helped. Awaiting abscess fluid culture and see if PIC needed. ID following  Objective: Vital signs in last 24 hours: Temp:  [98.2 F (36.8 C)-99.7 F (37.6 C)] 98.6 F (37 C) (03/22 1138) Pulse Rate:  [64-80] 70 (03/22 1138) Resp:  [18] 18 (03/22 1138) BP: (99-119)/(59-73) 112/67 (03/22 1138) SpO2:  [97 %-100 %] 100 % (03/22 1138) Weight change:  Last BM Date: 12/04/17 Afebrile General appearance: alert and cooperative Breasts wnl, pumping well  GI: soft, lower abdomen slight tenderness. Normal active bowel sounds with no peritoneal signs Extremities: no calf tenderness./swelling  Pelvic deferred  Abscess drain - working well, pt wonders if JP needed instead  Assessment/Plan: HD # 7 Postpartum day 15, Pelvic abscess, possible following endometritis, since no other source of infection.  ID to plan if PIC line/ IV Invanz 14 days needed. Pelvic drain draining well   Home health care to see patient today Urine cx sent. UA nitrite, no WBCs  V.Shelena Castelluccio, MD Cell: [phone]

## 2017-12-08 NOTE — Lactation Note (Signed)
Lactation Consultation Note  Patient Name: Sydney Little [id number] Date: 12/08/2017   I received a call from the 3rd floor letting me know that this patient wanted to speak with me about breastfeeding compatibility & her IV medication Sydney Little). Sydney Little is an L2. (All antibiotics that she received during her stay were L1 or L2). While looking at her MAR, I noted Pyridium, which is an L3 (per [employer] "Medications & Mother's Milk).   [employer] states that Pyridium should be avoided while breastfeeding, especially with an infant under 5 month of age. Pharmacy confirmed that Sydney Little is contraindicated during breastfeeding. The patient was made aware. She plans to discard the milk she has pumped that was taken during that time. Her RN, Sydney Little, is aware of this.   Patient was provided phone number for Sydney Little [phone]) if she'd like to learn more.    Sydney Little [facility] 12/08/2017, 4:35 PM

## 2017-12-08 NOTE — Progress Notes (Addendum)
Subjective: Feels better today. Ready to go home. Still has "UTI" symptoms 1st dose of Invanz today per ID   Objective: Vital signs in last 24 hours: Temp:  [98.2 F (36.8 C)-99.7 F (37.6 C)] 98.6 F (37 C) (03/22 1138) Pulse Rate:  [64-80] 70 (03/22 1138) Resp:  [18] 18 (03/22 1138) BP: (99-119)/(59-73) 112/67 (03/22 1138) SpO2:  [97 %-100 %] 100 % (03/22 1138) Weight change:  Last BM Date: 12/04/17 Afebrile General appearance: alert and cooperative Breasts wnl, pumping well  GI: soft, lower abdomen slight tenderness. Normal active bowel sounds with no peritoneal signs Extremities: no calf tenderness./swelling  Pelvic deferred  Abscess drain - working well, pt wonders if JP needed instead  Assessment/Plan: HD # 8 Postpartum day 16, Pelvic abscess, possible following endometritis, since no other source of infection.  Will d/c home today after PIC line and have IV Invanz 14 days, f/up ID 2 wks Pelvic drain, f/up drain clinic in 1 week - Drain clinic [phone] Home health care Urine cx pending. Will call pt with lab.   Warning s/s fever/ worse pain reviewed.   V.Kimba Lottes, MD Cell: [phone]

## 2017-12-10 NOTE — Discharge Summary (Signed)
Physician Discharge Summary  Patient ID: Sydney Little MRN: [id number] DOB/AGE: 1981/05/02 37 y.o.  Admit date: 12/01/2017 Discharge date: 12/08/2017  Admission Diagnoses: Puerperal fever, endometritis   Discharge Diagnoses:  Principal Problem:   Pelvic abscess in female Active Problems:   Fever, puerperal   Endometritis  Discharged Condition: Improved   Hospital Course: Patient admitted on 12/01/17 with history of fever of 102 at home on 3/14 and pelvic pain. Initial diagnosis was endometritis, she had pelvic sono at admission on 3/15 that noted postpartum enlarged uterus but no pelvic masses and possible endometritis with increased endometrial flow. She was started on Cefoxitin. On 3/16, pain was worse and hence CT pelvic done that noted fluid collection/ early abscess/ stranding anterior/superior to uterus, so Clindamycin was added for pelvic abscess. Her pain was slightly better on 3/18, but not resolved, though WBC was normalized on 3/19 /18, due to ongoing symptoms, another f/up pelvic sono was repeated on 3/19 that noted worsening abscess 9x4 cm (from 9x2 cm) and so after discussing with Interventional radiologist, she was sent to [facility] IR by care link for IR guided drainage and drain placement. Infectious Disease consult was placed. Drainage fluid was sent for culture and gram stain. Gram stain was negative and culture also returned no growth on 3/22. But per ID recommendations PIC line was placed and Invanz started in house, will continue at home for 2 weeks, home health nurse arranged.  Breast feeding plan reviewed.  Urine culture was pending at discharge.   Consults: Interventional radiology and Infectious disease (Dr Johnnye Sima)  Significant Diagnostic Studies: Pelvic sono, then CT, then repeat pelvic Sono and CT guided drain placement   Treatments: Cefoxitin and Clindamycin 3/15- 3/22 and changed to Invanz in 3/22                        Percocet  prn  Discharge Exam: Blood  pressure 112/67, pulse 70, temperature 98.6 F (37 C), temperature source Oral, resp. rate 18, height '5\' 5"'$  (1.651 m), weight 146 lb (66.2 kg), last menstrual period 11/21/2017, SpO2 100 %, currently breastfeeding. See Progress note from 3/22  Disposition: Home, will have home health RN visit  Discharge Instructions    Call MD for:  difficulty breathing, headache or visual disturbances   Complete by:  As directed    Call MD for:  extreme fatigue   Complete by:  As directed    Call MD for:  hives   Complete by:  As directed    Call MD for:  persistant dizziness or light-headedness   Complete by:  As directed    Call MD for:  persistant nausea and vomiting   Complete by:  As directed    Call MD for:  redness, tenderness, or signs of infection (pain, swelling, redness, odor or green/yellow discharge around incision site)   Complete by:  As directed    Call MD for:  severe uncontrolled pain   Complete by:  As directed    Call MD for:  temperature >100.4   Complete by:  As directed    Diet - low sodium heart healthy   Complete by:  As directed    Discharge instructions   Complete by:  As directed    Keep drain insertion site clean and dry.  Replace dressing as needed.  Record output amount daily and bring log with you to radiology appointment.  Flush catheter once daily with 5 mL sterile saline.  Schedulers will  contact you with date and time of appointment.   Home infusion instructions [facility] May follow [facility] Dosing Protocol; May administer Cathflo as needed to maintain patency of vascular access device.; Flushing of vascular access device: per [facility] Protocol: 0.9% NaCl pre/post medica...   Complete by:  As directed    Instructions:  May follow [facility] Dosing Protocol   Instructions:  May administer Cathflo as needed to maintain patency of vascular access device.   Instructions:  Flushing of vascular access device: per [facility] Protocol: 0.9% NaCl pre/post medication  administration and prn patency; Heparin 100 u/ml, 38m for implanted ports and Heparin 10u/ml, 524mfor all other central venous catheters.   Instructions:  May follow [facility] Anaphylaxis Protocol for First Dose Administration in the home: 0.9% NaCl at 25-50 ml/hr to maintain IV access for protocol meds. Epinephrine 0.3 ml IV/IM PRN and Benadryl 25-50 IV/IM PRN s/s of anaphylaxis.   Instructions:  [facility] Coordinator (RN) to assist per patient IV care needs in the home PRN.   Increase activity slowly   Complete by:  As directed    Sexual Activity Restrictions   Complete by:  As directed    6 weeks     Allergies as of 12/08/2017   No Known Allergies     Medication List    STOP taking these medications   AUGMENTIN 875-125 MG tablet Generic drug:  amoxicillin-clavulanate     TAKE these medications   acetaminophen 500 MG tablet Commonly known as:  TYLENOL Take 500 mg by mouth every 6 (six) hours as needed for mild pain or headache.   ertapenem IVPB Commonly known as:  INVANZ Inject 1 g into the vein daily for 14 days. Indication:  Pelvic abscess Last Day of Therapy:  12/22/17 (first dose given prior to d/c from hospital on 12/08/17) Labs - Once weekly:  CBC/D and BMP, Labs - Every other week:  ESR and CRP   ibuprofen 600 MG tablet Commonly known as:  ADVIL,MOTRIN Take 1 tablet (600 mg total) by mouth every 6 (six) hours.   magnesium oxide 400 (241.3 Mg) MG tablet Commonly known as:  MAG-OX Take 0.5 tablets (200 mg total) by mouth daily.   multivitamin-prenatal 27-0.8 MG Tabs tablet Take 1 tablet by mouth daily at 12 noon.   oxyCODONE-acetaminophen 5-325 MG tablet Commonly known as:  PERCOCET/ROXICET Take 1 tablet by mouth every 6 (six) hours as needed for moderate pain or severe pain.            Home Infusion Instuctions  (From admission, onward)        Start     Ordered   12/08/17 0000  Home infusion instructions [facility] May follow [facility] Protocol; May administer Cathflo as needed to maintain patency of vascular access device.; Flushing of vascular access device[facility]: 0.9% NaCl pre/post medica...    Question Answer Comment  Instructions May follow [facility] Protocol   Instructions May administer Cathflo as needed to maintain patency of vascular access device.   Instructions Flushing of vascular access device: per [facility]: 0.9% NaCl pre/post medication administration and prn patency; Heparin 100 u/ml, 68m52mor implanted ports and Heparin 10u/ml, 68ml104mr all other central venous catheters.   Instructions May follow [facility] Anaphylaxis Protocol for First Dose Administration in the home: 0.9% NaCl at 25-50 ml/hr to maintain IV access for protocol meds. Epinephrine 0.3 ml IV/IM PRN and Benadryl 25-50 IV/IM  PRN s/s of anaphylaxis.   Instructions [facility] Infusion Coordinator (RN) to assist per patient IV care needs in the home PRN.      12/08/17 1023     Follow-up Information    Arne Cleveland, MD Follow up.   Specialties:  Interventional Radiology, Radiology Why:  Schedulers will contact you with date and time of appointment.  Contact information: [address] [place] [address] [phone]        Azucena Fallen, MD Follow up in 3 week(s).   Specialty:  Obstetrics and Gynecology Contact information: [address] [place]  [address] [phone]           Signed: Elveria Royals 12/10/2017, 10:03 PM

## 2017-12-11 ENCOUNTER — Encounter: Payer: Self-pay | Admitting: Infectious Diseases

## 2017-12-11 ENCOUNTER — Other Ambulatory Visit: Payer: Self-pay | Admitting: Obstetrics & Gynecology

## 2017-12-11 ENCOUNTER — Other Ambulatory Visit: Payer: Self-pay | Admitting: Student

## 2017-12-11 DIAGNOSIS — N739 Female pelvic inflammatory disease, unspecified: Secondary | ICD-10-CM

## 2017-12-11 DIAGNOSIS — N719 Inflammatory disease of uterus, unspecified: Secondary | ICD-10-CM

## 2017-12-11 LAB — AEROBIC/ANAEROBIC CULTURE W GRAM STAIN (SURGICAL/DEEP WOUND): Culture: NO GROWTH

## 2017-12-14 ENCOUNTER — Other Ambulatory Visit: Payer: Self-pay | Admitting: Obstetrics & Gynecology

## 2017-12-14 ENCOUNTER — Ambulatory Visit
Admission: RE | Admit: 2017-12-14 | Discharge: 2017-12-14 | Disposition: A | Discharge status: Home / Self Care | Payer: No Typology Code available for payment source | Source: Ambulatory Visit | Attending: Obstetrics & Gynecology | Admitting: Obstetrics & Gynecology

## 2017-12-14 ENCOUNTER — Encounter: Payer: Self-pay | Admitting: Radiology

## 2017-12-14 ENCOUNTER — Ambulatory Visit
Admission: RE | Admit: 2017-12-14 | Discharge: 2017-12-14 | Disposition: A | Discharge status: Home / Self Care | Payer: No Typology Code available for payment source | Source: Ambulatory Visit | Attending: Student | Admitting: Student

## 2017-12-14 DIAGNOSIS — N719 Inflammatory disease of uterus, unspecified: Secondary | ICD-10-CM

## 2017-12-14 DIAGNOSIS — N739 Female pelvic inflammatory disease, unspecified: Secondary | ICD-10-CM

## 2017-12-14 HISTORY — PX: IR RADIOLOGIST EVAL & MGMT: IMG5224

## 2017-12-14 MED ORDER — IOPAMIDOL (ISOVUE-300) INJECTION 61%
100.0000 mL | Freq: Once | INTRAVENOUS | Status: AC | PRN
  Administered 2017-12-14: 100 mL via INTRAVENOUS

## 2017-12-14 NOTE — Progress Notes (Signed)
Referring Physician(s): Dr Manon Hilding  Chief Complaint: The patient is seen in follow up today s/p 12/06/17: Technically successful pelvic abscess drain catheter placement.  History of present illness:  Post partum pelvic abscess Drain intact OP serous 10-20 cc daily' Flushing 5-10 cc daily Starting to leak around skin site with flushing Still with some pain at site No bleeding PICC-- Invanz daily  Denies N/V Denies fever chills  Follows with Dr Benjie Karvonen  Past Medical History:  Diagnosis Date  . Acute blood loss anemia 12/17/2013  . Acute blood loss anemia 06/12/2015  . Fibroid    large 10cm  . Pelvic abscess in female 12/05/2017  . Postpartum care following vaginal delivery (3/29) 12/15/2013  . SVD (spontaneous vaginal delivery) 12/17/2013  . Vaginal Pap smear, abnormal     Past Surgical History:  Procedure Laterality Date  . DILATION AND CURETTAGE OF UTERUS     2001/2004  . WISDOM TOOTH EXTRACTION      Allergies: Patient has no known allergies.  Medications: Prior to Admission medications   Medication Sig Start Date End Date Taking? Authorizing Provider  acetaminophen (TYLENOL) 500 MG tablet Take 500 mg by mouth every 6 (six) hours as needed for mild pain or headache.     [provider]  ertapenem (INVANZ) IVPB Inject 1 g into the vein daily for 14 days. Indication:  Pelvic abscess Last Day of Therapy:  12/22/17 (first dose given prior to d/c from hospital on 12/08/17) Labs - Once weekly:  CBC/D and BMP, Labs - Every other week:  ESR and CRP 12/08/17 12/22/17  Azucena Fallen, MD  ibuprofen (ADVIL,MOTRIN) 600 MG tablet Take 1 tablet (600 mg total) by mouth every 6 (six) hours. 11/23/17   Sigmon, Tyler Deis, CNM  magnesium oxide (MAG-OX) 400 (241.3 MG) MG tablet Take 0.5 tablets (200 mg total) by mouth daily. 06/12/15   Laury Deep, CNM  oxyCODONE-acetaminophen (PERCOCET/ROXICET) 5-325 MG tablet Take 1 tablet by mouth every 6 (six) hours as needed for moderate pain  or severe pain. 12/08/17   Azucena Fallen, MD  Prenatal Vit-Fe Fumarate-FA (MULTIVITAMIN-PRENATAL) 27-0.8 MG TABS tablet Take 1 tablet by mouth daily at 12 noon.    [provider]     Family History  Problem Relation Age of Onset  . Hypertension Father   . Cancer Mother        breast  . Cancer Maternal Grandfather        pancreatic  . Cancer Paternal Grandmother        lymphoma  . Cancer Paternal Grandfather        prostate    Social History   Socioeconomic History  . Marital status: Married    Spouse name: Not on file  . Number of children: Not on file  . Years of education: Not on file  . Highest education level: Not on file  Occupational History  . Not on file  Social Needs  . Financial resource strain: Not on file  . Food insecurity:    Worry: Not on file    Inability: Not on file  . Transportation needs:    Medical: Not on file    Non-medical: Not on file  Tobacco Use  . Smoking status: Never Smoker  . Smokeless tobacco: Never Used  Substance and Sexual Activity  . Alcohol use: Yes  . Drug use: No  . Sexual activity: Yes    Birth control/protection: None  Lifestyle  . Physical activity:    Days  per week: Not on file    Minutes per session: Not on file  . Stress: Not on file  Relationships  . Social connections:    Talks on phone: Not on file    Gets together: Not on file    Attends religious service: Not on file    Active member of club or organization: Not on file    Attends meetings of clubs or organizations: Not on file    Relationship status: Not on file  Other Topics Concern  . Not on file  Social History Narrative  . Not on file     Vital Signs: LMP 11/21/2017 (Exact Date) Comment: delivered  T 98.2  Physical Exam  Abdominal: Soft. Bowel sounds are normal.  Musculoskeletal: Normal range of motion.  Neurological: She is alert.  Skin: Skin is warm and dry.  Site is clean and dry Minimal tenderness to touch- no bleeding OP  serous color 10 -20 cc daily Flushing 5-10 cc daily    Psychiatric: She has a normal mood and affect.  Vitals reviewed.   Imaging: No results found.  Labs:  CBC: Recent Labs    11/23/17 0518 12/01/17 2227 12/03/17 0547 12/05/17 0548  WBC 13.0* 14.3* 10.1 9.0  HGB 9.5* 10.6* 10.6* 10.8*  HCT 29.1* 33.1* 33.1* 33.8*  PLT 164 319 310 336    COAGS: Recent Labs    12/06/17 0603  INR 0.98    BMP: Recent Labs    12/06/17 0603  NA 136  K 4.4  CL 106  CO2 21*  GLUCOSE 86  BUN 15  CALCIUM 8.7*  CREATININE 0.76  GFRNONAA >60  GFRAA >60    LIVER FUNCTION TESTS: No results for input(s): BILITOT, AST, ALT, ALKPHOS, PROT, ALBUMIN in the last 8760 hours.  Assessment:  Post partum pelvic abscess Drain placed 3/20 CT today reveals much reduced collection per Dr Blane Ohara still with residual abscess No removal today Injection performed today -- comparison for return in 2 weeks No CT needed at follow up Return 2 weeks; decrease flushes to 2-3 cc daily Follow with Dr Benjie Karvonen  Signed: Monia Sabal A, PA-C 12/14/2017, 1:50 PM   Please refer to Dr. Earleen Newport attestation of this note for management and plan.

## 2017-12-17 ENCOUNTER — Encounter: Payer: Self-pay | Admitting: Infectious Diseases

## 2017-12-19 ENCOUNTER — Encounter: Payer: Self-pay | Admitting: Infectious Diseases

## 2017-12-19 NOTE — Telephone Encounter (Signed)
Feels better, some annoyance from drain and PIC.  I called and explained her labs to her.  We reviewed her CT scan (abscess now down to 3.8 cm was previously 10.4 x 13.5 x 7.7) She has f/u with me on 4-9.

## 2017-12-25 ENCOUNTER — Encounter: Payer: Self-pay | Admitting: Infectious Diseases

## 2017-12-25 ENCOUNTER — Ambulatory Visit (INDEPENDENT_AMBULATORY_CARE_PROVIDER_SITE_OTHER): Payer: No Typology Code available for payment source | Admitting: Infectious Diseases

## 2017-12-25 DIAGNOSIS — N739 Female pelvic inflammatory disease, unspecified: Secondary | ICD-10-CM

## 2017-12-25 NOTE — Progress Notes (Signed)
   Subjective:    Patient ID: Sydney Little, female    DOB: 03-23-1981, 37 y.o.   MRN: [id number]  HPI 37 y.o. female [id number] who had NSVD 11-22-17 , d/c within 24h.  By 3-14 she developed fever to 102 and developed abd pain. She was started on augmentin, and then returned 3-15 with peritoneal signs (increased pain with movement).  She had WBC 14.3 in ED. She was started on cefoxitin. She was not seen to have retained products on u/s. She did have CT scan (3-16) to follow which showed "Persistent enlargement of the uterus with ill-defined thickened endometrium, which could indicate postpartum endometritis. In addition, there is a small abscess anterior to the uterine body with extensive surrounding inflammatory changes, as above." Clindamycin was added to her regimen and she was planned to be d/c the following day. Unfortunately, she has had worsened pain which prohibited d/c.  She had repeat u/s done today which showed: 9.4 x 4.3 x 3.6 cm mass/abscess anterior to the uterus and a 3.3 x  2.0 x 3.1 cm collection anterior to the r ovary.    She underwent IR drain placement. Her Cx was (-). She was d/c home on Invanz on 3-22 for a planned 2 weeks.   She had f/u with IR on 3-28 and was noted to have some residual abscess. Her drain was kept in place, hopefully out on 4-11. No d/c from drain. Has had some drainage from around site when she flushes.  No f/c. No pain.  PIC is out.   Baby is doing well.   Review of Systems  Constitutional: Negative for appetite change, chills and fever.  Gastrointestinal: Negative for constipation and diarrhea.  Genitourinary: Negative for difficulty urinating and dysuria.  Psychiatric/Behavioral: Negative for sleep disturbance.  Please see HPI. All other systems reviewed and negative.      Objective:   Physical Exam  Constitutional: She is oriented to person, place, and time. She appears well-developed and well-nourished.  HENT:  Mouth/Throat: No oropharyngeal  exudate.  Eyes: Pupils are equal, round, and reactive to light. EOM are normal.  Neck: Neck supple.  Cardiovascular: Normal rate, regular rhythm and normal heart sounds.  Pulmonary/Chest: Effort normal and breath sounds normal.  Abdominal: Soft. Bowel sounds are normal. There is no tenderness. There is no rebound.    Musculoskeletal: She exhibits no edema.  Lymphadenopathy:    She has no cervical adenopathy.  Neurological: She is alert and oriented to person, place, and time.  Psychiatric: She has a normal mood and affect.       Assessment & Plan:

## 2017-12-25 NOTE — Assessment & Plan Note (Signed)
She is doing well She is off anbx and is asx She has minimal drainage from her drain.  I asked her to let me know if she has fever, chills, worsening abd pain.  I am happy to see her as needed.

## 2017-12-28 ENCOUNTER — Ambulatory Visit
Admission: RE | Admit: 2017-12-28 | Discharge: 2017-12-28 | Disposition: A | Discharge status: Home / Self Care | Payer: No Typology Code available for payment source | Source: Ambulatory Visit | Attending: Obstetrics & Gynecology | Admitting: Obstetrics & Gynecology

## 2017-12-28 ENCOUNTER — Encounter: Payer: Self-pay | Admitting: Radiology

## 2017-12-28 DIAGNOSIS — N739 Female pelvic inflammatory disease, unspecified: Secondary | ICD-10-CM

## 2017-12-28 DIAGNOSIS — N719 Inflammatory disease of uterus, unspecified: Secondary | ICD-10-CM

## 2017-12-28 HISTORY — PX: IR RADIOLOGIST EVAL & MGMT: IMG5224

## 2017-12-28 NOTE — Progress Notes (Signed)
Patient ID: Sydney Little, female   DOB: Feb 09, 1981, 37 y.o.   MRN: [id number]        Chief Complaint: Abdominal abscess, post percutaneous drainage catheter placement    Referring Physician(s): Mody,Vaishali  History of Present Illness: Sydney Little is a 37 y.o. female who is well-known to the interventional radiology department after undergoing percutaneous drainage catheter placement on 12/06/2017 by Dr. Vernard Gambles secondary to development of a postpartum fluid collection within the lower abdomen and pelvis.  Patient was subsequently seen in interventional radiology drain clinic on 12/14/2017 with CT scan performed at that time demonstrating resolution of the pelvic fluid collection.  Subsequent drainage catheter injection demonstrated opacification of the decompressed abscess cavity without definitive communication to any adjacent structure/organ.  Patient returns today to the interventional radiology drain clinic for drainage catheter evaluation and management.  The patient is not flushing the percutaneous drainage catheter.  The patient reports little to no output from the percutaneous drainage catheter since the time of the last appointment approximately 2 weeks ago.  The patient has completed her course of prescribed antibiotics and denies recurrence of symptoms.  Specifically, no worsening abdominal pain.  No fever or chills.  Past Medical History:  Diagnosis Date  . Acute blood loss anemia 12/17/2013  . Acute blood loss anemia 06/12/2015  . Fibroid    large 10cm  . Pelvic abscess in female 12/05/2017  . Postpartum care following vaginal delivery (3/29) 12/15/2013  . SVD (spontaneous vaginal delivery) 12/17/2013  . Vaginal Pap smear, abnormal     Past Surgical History:  Procedure Laterality Date  . DILATION AND CURETTAGE OF UTERUS     2001/2004  . IR RADIOLOGIST EVAL & MGMT  12/14/2017  . IR RADIOLOGIST EVAL & MGMT  12/28/2017  . WISDOM TOOTH EXTRACTION       Allergies: Patient has no known allergies.  Medications: Prior to Admission medications   Medication Sig Start Date End Date Taking? Authorizing Provider  acetaminophen (TYLENOL) 500 MG tablet Take 500 mg by mouth every 6 (six) hours as needed for mild pain or headache.     [provider]  ibuprofen (ADVIL,MOTRIN) 600 MG tablet Take 1 tablet (600 mg total) by mouth every 6 (six) hours. 11/23/17   Sigmon, Tyler Deis, CNM  magnesium oxide (MAG-OX) 400 (241.3 MG) MG tablet Take 0.5 tablets (200 mg total) by mouth daily. Patient not taking: Reported on 12/25/2017 06/12/15   Laury Deep, CNM  oxyCODONE-acetaminophen (PERCOCET/ROXICET) 5-325 MG tablet Take 1 tablet by mouth every 6 (six) hours as needed for moderate pain or severe pain. Patient not taking: Reported on 12/25/2017 12/08/17   Azucena Fallen, MD  Prenatal Vit-Fe Fumarate-FA (MULTIVITAMIN-PRENATAL) 27-0.8 MG TABS tablet Take 1 tablet by mouth daily at 12 noon.    [provider]     Family History  Problem Relation Age of Onset  . Hypertension Father   . Cancer Mother        breast  . Cancer Maternal Grandfather        pancreatic  . Cancer Paternal Grandmother        lymphoma  . Cancer Paternal Grandfather        prostate    Social History   Socioeconomic History  . Marital status: Married    Spouse name: Not on file  . Number of children: Not on file  . Years of education: Not on file  . Highest education level: Not on file  Occupational History  . Not on  file  Social Needs  . Financial resource strain: Not on file  . Food insecurity:    Worry: Not on file    Inability: Not on file  . Transportation needs:    Medical: Not on file    Non-medical: Not on file  Tobacco Use  . Smoking status: Never Smoker  . Smokeless tobacco: Never Used  Substance and Sexual Activity  . Alcohol use: Yes  . Drug use: No  . Sexual activity: Yes    Birth control/protection: None  Lifestyle  . Physical  activity:    Days per week: Not on file    Minutes per session: Not on file  . Stress: Not on file  Relationships  . Social connections:    Talks on phone: Not on file    Gets together: Not on file    Attends religious service: Not on file    Active member of club or organization: Not on file    Attends meetings of clubs or organizations: Not on file    Relationship status: Not on file  Other Topics Concern  . Not on file  Social History Narrative  . Not on file    ECOG Status: 1 - Symptomatic but completely ambulatory  Review of Systems: A 12 point ROS discussed and pertinent positives are indicated in the HPI above.  All other systems are negative.  Review of Systems  Constitutional: Negative for chills and fever.  Respiratory: Negative.   Cardiovascular: Negative.   Gastrointestinal: Negative.  Negative for abdominal pain.    Vital Signs: BP (!) 113/58   Pulse 73   Temp 98 F (36.7 C)   SpO2 98%   Physical Exam  Constitutional: She appears well-developed and well-nourished.  HENT:  Head: Normocephalic and atraumatic.  Abdominal:    Location of the percutaneous drainage catheter.    Mallampati Score:     Imaging: US Pelvis (transabdominal Only)  Result Date: 12/02/2017 CLINICAL DATA:  Postpartum pelvic pain and fever. EXAM: TRANSABDOMINAL ULTRASOUND OF PELVIS TECHNIQUE: Transabdominal ultrasound examination of the pelvis was performed including evaluation of the uterus, ovaries, adnexal regions, and pelvic cul-de-sac. COMPARISON:  None. FINDINGS: Uterus Measurements: 13.8 x 8.1 x 10.9 cm. Intramural fibroid at the left uterine fundus, measuring 2.1 x 1.4 x 2.5 cm. Endometrium Small amount of fluid in the endometrial cavity. Endometrium is not clearly measurable due to postpartum status. Right ovary Not visualized Left ovary Measurements: 3.7 x 2.0 x 2.8 cm. Normal appearance/no adnexal mass. Other findings:  Trace free fluid in the posterior cul-de-sac.  IMPRESSION: 1. No acute abnormality of the uterus or endometrial cavity. 2. Small left fundal uterine fibroid. 3. Nonvisualization of the right ovary. Electronically Signed   By: Ulyses Jarred M.D.   On: 12/02/2017 00:19   Ct Abdomen Pelvis W Contrast  Result Date: 12/14/2017 CLINICAL DATA:  37 year old female with a history of postpartum pelvic abscess. CT-guided drainage was performed 12/06/2017. EXAM: CT ABDOMEN AND PELVIS WITH CONTRAST TECHNIQUE: Multidetector CT imaging of the abdomen and pelvis was performed using the standard protocol following bolus administration of intravenous contrast. CONTRAST:  164mL ISOVUE-300 IOPAMIDOL (ISOVUE-300) INJECTION 61% COMPARISON:  12/02/2017, 12/06/2017 FINDINGS: Lower chest: No acute abnormality. Hepatobiliary: No focal liver abnormality is seen. No gallstones, gallbladder wall thickening, or biliary dilatation. Pancreas: Unremarkable. No pancreatic ductal dilatation or surrounding inflammatory changes. Spleen: Normal in size without focal abnormality. Adrenals/Urinary Tract: Adrenal glands are unremarkable. Right kidney unremarkable with no hydronephrosis. Cyst at the posterior collecting system  with 2 small nonobstructive stones. Left kidney with no hydronephrosis. No perinephric stranding. Small nonobstructive stone at the superior collecting system. Unremarkable urinary bladder. Stomach/Bowel: Stomach is within normal limits. Normal appendix. No evidence of bowel wall thickening, distention, or inflammatory changes. Vascular/Lymphatic: No significant vascular findings are present. No enlarged abdominal or pelvic lymph nodes. Reproductive: Uterus continues to decrease in size. Minimal endometrial fluid. Unremarkable adnexa. Pigtail drainage catheter anterior to the uterus with a small residual rim enhancing fluid collection at the tip of the catheter measuring 3.8 cm. This is significantly decreased in size compared to the pre drainage CT. Fluid tracks along the  right aspect of the pelvis, and is inseparable from small bowel loops. Other: No abdominal wall hernia or abnormality. No abdominopelvic ascites. Musculoskeletal: No acute or significant osseous findings. IMPRESSION: Pigtail drainage catheter within the anterior pelvis, with decreasing size of previously drained abscess. There is small residual fluid collection adjacent to the catheter measuring 3.8 cm, compatible with small persisting abscess component. The fluid tracks along the right aspect of the uterus. Uterus significantly decreased in size with small amount of endometrial fluid. Redemonstration of nonobstructive bilateral nephrolithiasis. Signed, Dulcy Fanny. Earleen Newport DO [facility] Radiology PLAN: Drain injection was performed today as a baseline. The drain will be maintained, with a log maintained of output. Would advise no scheduled CT imaging necessary at this time. When the patient returns injection may be repeated to observe for change. Electronically Signed   By: Corrie Mckusick D.O.   On: 12/14/2017 18:09   Ct Abdomen Pelvis W Contrast  Result Date: 12/02/2017 CLINICAL DATA:  37 year old female with history of right-sided flank and lower pelvic pain with fever to 102 degrees. Patient is recently postpartum with history of spontaneous vaginal delivery on 11/22/2017. EXAM: CT ABDOMEN AND PELVIS WITH CONTRAST TECHNIQUE: Multidetector CT imaging of the abdomen and pelvis was performed using the standard protocol following bolus administration of intravenous contrast. CONTRAST:  153mL ISOVUE-300 IOPAMIDOL (ISOVUE-300) INJECTION 61% COMPARISON:  No priors. FINDINGS: Lower chest: Unremarkable. Hepatobiliary: No cystic or solid hepatic lesions. No intra or extrahepatic biliary ductal dilatation. Gallbladder is normal in appearance. Pancreas: No pancreatic mass. No pancreatic ductal dilatation. No pancreatic or peripancreatic fluid or inflammatory changes. Spleen:  Unremarkable. Adrenals/Urinary Tract: Several nonobstructive calculi are noted within the collecting systems of both kidneys, largest of which measures up to 7 mm in the interpolar region of the right kidney, potentially within a calyceal diverticulum. No suspicious renal lesions. No hydroureteronephrosis. Urinary bladder is normal in appearance. Stomach/Bowel: Normal appearance of the stomach. No pathologic dilatation of small bowel or colon. Normal appendix. Vascular/Lymphatic: No atherosclerotic disease, aneurysm or dissection noted in the abdominal or pelvic vasculature. No lymphadenopathy noted in the abdomen or pelvis. Reproductive: Postpartum uterus appears enlarged measuring 10.4 x 13.5 x 7.7 cm, with persistent ill-defined thickening of the endometrium which measures up to 16 mm. Immediately anterior to the uterus there is a multilocular fluid collection which demonstrates some peripheral enhancement (coronal image 43 of series 602 and sagittal image 85 of series 603) measuring approximately 2.1 x 9.8 x 2.7 cm, with adjacent inflammatory changes in the surrounding fat, concerning for a small abscess. Ovaries are unremarkable in appearance. Other: No significant volume of ascites.  No pneumoperitoneum. Musculoskeletal: There are no aggressive appearing lytic or blastic lesions noted in the visualized portions of the skeleton. IMPRESSION: 1. Persistent enlargement of the uterus with ill-defined thickened endometrium, which could indicate postpartum endometritis. In addition,  there is a small abscess anterior to the uterine body with extensive surrounding inflammatory changes, as above. 2. Normal appendix. 3. Nonobstructive calculi in the collecting systems of both kidneys measuring up to 7 mm on the right side. Electronically Signed   By: Vinnie Langton M.D.   On: 12/02/2017 18:13   Dg Sinus/fist Tube Chk-non Gi  Result Date: 12/14/2017 INDICATION: 37 year old female with a history of postpartum pelvic  abscess. EXAM: THROUGH THE TUBE INJECTION MEDICATIONS: None ANESTHESIA/SEDATION: None COMPLICATIONS: None PROCEDURE: Informed written consent was obtained from the patient after a thorough discussion of the procedural risks, benefits and alternatives. All questions were addressed. Maximal Sterile Barrier Technique was utilized including caps, mask, sterile gowns, sterile gloves, sterile drape, hand hygiene and skin antiseptic. A timeout was performed prior to the initiation of the procedure. Patient was positioned supine position on the fluoroscopy table. Drain injection was performed. Patient tolerated the procedure well and remained hemodynamically stable throughout. FINDINGS: Drain injection confirms small residual abscess cavity in the pelvis. Drain was not removed at today's date. IMPRESSION: Status post drain injection demonstrates small residual abscess cavity. Signed, Dulcy Fanny. Earleen Newport DO [facility] Radiology PLAN: Niel Hummer will stay in place on today's date. Anticipate 2 week follow-up with drain injection only and no CT scan given the patient's age. Today's study will serve as a baseline, with maintained log of output. Electronically Signed   By: Corrie Mckusick D.O.   On: 12/14/2017 14:16   Dg Chest Port 1 View  Result Date: 12/08/2017 CLINICAL DATA:  PICC line placement EXAM: PORTABLE CHEST 1 VIEW COMPARISON:  Portable exam 1607 hours without priors for comparison FINDINGS: RIGHT arm PICC line with tip projecting over SVC. Normal heart size, mediastinal contours, and pulmonary vascularity. Lungs clear. No pleural effusion or pneumothorax. Bones unremarkable. IMPRESSION: Tip of RIGHT arm PICC line projects over SVC. No acute infiltrates. Electronically Signed   By: Lavonia Dana M.D.   On: 12/08/2017 16:42   US Pelvic Complete With Transvaginal  Result Date: 12/05/2017 CLINICAL DATA:  Patient with acute pelvic abscess. Lower abdominal pain. EXAM:  TRANSABDOMINAL AND TRANSVAGINAL ULTRASOUND OF PELVIS TECHNIQUE: Both transabdominal and transvaginal ultrasound examinations of the pelvis were performed. Transabdominal technique was performed for global imaging of the pelvis including uterus, ovaries, adnexal regions, and pelvic cul-de-sac. It was necessary to proceed with endovaginal exam following the transabdominal exam to visualize the endometrium and adnexal structures. COMPARISON:  CT abdomen pelvis 12/02/2017; pelvic ultrasound 12/01/2017 FINDINGS: Uterus Measurements: 13.4 x 7.1 x 9.8 cm. Uterus is heterogeneous in echogenicity. No fibroids are visualized. Endometrium Thickness: 15 mm. Endometrium appears thickened with small amount of endometrial fluid. Right ovary Measurements: 2.7 x 1.4 x 1.8 cm. Normal appearance/no adnexal mass. Left ovary Measurements: 3.8 x 1.2 x 1.8 cm. Normal appearance/no adnexal mass. Other findings There is a complex cystic mass within the pelvis superior to the uterus measuring 9.4 x 4.3 x 3.6 cm. There is an additional complex fluid collection inferior to the right ovary measuring 3.3 x 2.0 x 3.1 cm. IMPRESSION: 1. Nonspecific complex fluid collection anterior and superior to the uterus which may represent abscess. Additionally there is a smaller complex fluid collection inferior and posterior to the right ovary. 2. Endometrium is thickened which is nonspecific and may represent endometrial blood products. Postpartum endometritis is a consideration. Hypovascular retained products of conception not excluded. Recommend short-term follow-up pelvic ultrasound. Electronically Signed   By: Lovey Newcomer M.D.   On:  12/05/2017 09:43   Ct Image Guided Fluid Drain By Catheter  Result Date: 12/07/2017 CLINICAL DATA:  Recently postpartum. Abdominal pain. CT demonstrates loculated fluid collection anterior to the uterus. EXAM: CT GUIDED DRAINAGE OF PELVIC ABSCESS ANESTHESIA/SEDATION: Intravenous Fentanyl and Versed were administered as  conscious sedation during continuous monitoring of the patient's level of consciousness and physiological / cardiorespiratory status by the radiology RN, with a total moderate sedation time of 17 minutes. PROCEDURE: The procedure, risks, benefits, and alternatives were explained to the patient. Questions regarding the procedure were encouraged and answered. The patient understands and consents to the procedure. Select axial scans through the pelvis performed. The collection was localized and an appropriate skin entry site was determined and marked. The operative field was prepped with chlorhexidinein a sterile fashion, and a sterile drape was applied covering the operative field. A sterile gown and sterile gloves were used for the procedure. Local anesthesia was provided with 1% Lidocaine. Under CT fluoroscopic guidance, a 19 gauge percutaneous entry needle was advanced into the collection. Clear yellow fluid could be aspirated. An Amplatz wire advanced easily within the collection, position confirmed on CT. Tract dilated to facilitate placement of a 10 French pigtail drain catheter, formed centrally within the collection. A sample of the aspirate sent for Gram stain and culture. The catheter was secured externally with 0 Prolene suture and StatLock and placed to gravity drain bag. The patient tolerated the procedure well. COMPLICATIONS: None immediate FINDINGS: The loculated fluid collection anterior to the uterus was again localized. 10 French drain catheter placed as above IMPRESSION: 1. Technically successful pelvic abscess drain catheter placement. Electronically Signed   By: Lucrezia Europe M.D.   On: 12/07/2017 11:30   Ir Radiologist Eval & Mgmt  Result Date: 12/28/2017 Please refer to notes tab for details about interventional procedure. (Op Note)  Ir Radiologist Eval & Mgmt  Result Date: 12/14/2017 Please refer to notes tab for details about interventional procedure. (Op Note)   Labs:  CBC: Recent  Labs    11/23/17 0518 12/01/17 2227 12/03/17 0547 12/05/17 0548  WBC 13.0* 14.3* 10.1 9.0  HGB 9.5* 10.6* 10.6* 10.8*  HCT 29.1* 33.1* 33.1* 33.8*  PLT 164 319 310 336    COAGS: Recent Labs    12/06/17 0603  INR 0.98    BMP: Recent Labs    12/06/17 0603  NA 136  K 4.4  CL 106  CO2 21*  GLUCOSE 86  BUN 15  CALCIUM 8.7*  CREATININE 0.76  GFRNONAA >60  GFRAA >60    LIVER FUNCTION TESTS: No results for input(s): BILITOT, AST, ALT, ALKPHOS, PROT, ALBUMIN in the last 8760 hours.  TUMOR MARKERS: No results for input(s): AFPTM, CEA, CA199, CHROMGRNA in the last 8760 hours.  Assessment and Plan:  Sydney Little is a 38 y.o. female who is well-known to the interventional radiology department after undergoing percutaneous drainage catheter placement on 12/06/2017 by Dr. Vernard Gambles secondary to development of a postpartum fluid collection within the lower abdomen and pelvis.  Patient was subsequently seen in interventional radiology drain clinic on 12/14/2017 with CT scan performed at that time demonstrating resolution of the pelvic fluid collection.  Subsequent drainage catheter injection demonstrated opacification of the decompressed abscess cavity without definitive communication to any adjacent structure/organ.  As patient reports little to no output from the percutaneous drainage catheter since the time of the last appointment approximately 2 weeks ago as well as near complete resolution on previous imaging, the decision was made to  remove the drainage catheter.    Note, given patient's young age and postpartum state I do not feel a repeat drainage catheter injection is necessary prior to drainage catheter removal.  This was discussed and agreed upon with the patient prior to drainage catheter removal.  The external portion of the drainage catheter was cut and the drainage catheter was removed intact.  A dressing was placed.  The patient tolerated procedure well without  immediate postprocedural complication.  Patient was instructed to call the interventional radiology drain clinic with any interval questions or concerns though may otherwise follow-up on a PRN basis.  A copy of this report was sent to the requesting provider on this date.  Electronically Signed: Sandi Mariscal 12/28/2017, 12:18 PM   I spent a total of 10 Minutes in face to face in clinical consultation, greater than 50% of which was counseling/coordinating care for percutaneous drainage catheter evaluation and management.

## 2018-06-26 ENCOUNTER — Ambulatory Visit (INDEPENDENT_AMBULATORY_CARE_PROVIDER_SITE_OTHER): Payer: No Typology Code available for payment source | Admitting: Ophthalmology

## 2018-06-26 ENCOUNTER — Encounter (INDEPENDENT_AMBULATORY_CARE_PROVIDER_SITE_OTHER): Payer: Self-pay | Admitting: Ophthalmology

## 2018-06-26 DIAGNOSIS — H35413 Lattice degeneration of retina, bilateral: Secondary | ICD-10-CM | POA: Diagnosis not present

## 2018-06-26 DIAGNOSIS — H52203 Unspecified astigmatism, bilateral: Secondary | ICD-10-CM

## 2018-06-26 DIAGNOSIS — G43109 Migraine with aura, not intractable, without status migrainosus: Secondary | ICD-10-CM | POA: Diagnosis not present

## 2018-06-26 DIAGNOSIS — H539 Unspecified visual disturbance: Secondary | ICD-10-CM | POA: Diagnosis not present

## 2018-06-26 DIAGNOSIS — H3581 Retinal edema: Secondary | ICD-10-CM

## 2018-06-26 DIAGNOSIS — H5213 Myopia, bilateral: Secondary | ICD-10-CM

## 2018-06-26 NOTE — Progress Notes (Signed)
[facility] Note  06/26/2018     CHIEF COMPLAINT Patient presents for Retina Evaluation   HISTORY OF PRESENT ILLNESS: Sydney Little is a 37 y.o. female who presents to the clinic today for:   HPI    Retina Evaluation    In right eye.  This started 3 days ago.  Duration of days.  Associated Symptoms Flashes.  Negative for Distortion, Pain, Photophobia, Trauma, Jaw Claudication, Fever, Fatigue, Weight Loss, Scalp Tenderness, Shoulder/Hip pain, Glare, Redness, Blind Spot and Floaters.  Context:  distance vision, mid-range vision and near vision.  Treatments tried include no treatments.  I, the attending physician,  performed the HPI with the patient and updated documentation appropriately.          Comments    37 y/o female pt referred by Dr. Zenia Resides for eval of possible retinal tear OD.  Pt began noticing intermittent temporal flashes OD 3 days ago.  Last occurred yesterday, and lasted for about 15 min.  No associated eye pain or headaches.  No change in [place] OU.  No gtts.  No hx of anything like this ever happening before.       Last edited by Bernarda Caffey, MD on 06/26/2018 11:22 AM. (History)    Pt is a regular pt of [facility]; Pt states she has been seeing flashes "3 times" in the last 3 weeks; Pt states she felt like flashes were coming from OD; Pt states episodes last for 15 minutes, pt states "its like a white out"; Pt reports having headaches and migraines;   Referring physician: Debbra Riding, MD [address] [place], [place] [address]  HISTORICAL INFORMATION:   Selected notes from the MEDICAL RECORD NUMBER Referred by Dr. Zenia Resides for retinal hole    CURRENT MEDICATIONS: No current outpatient medications on file. (Ophthalmic Drugs)   No current facility-administered medications for this visit.  (Ophthalmic Drugs)   Current Outpatient Medications (Other)  Medication Sig  . Prenatal Vit-Fe Fumarate-FA (MULTIVITAMIN-PRENATAL)  27-0.8 MG TABS tablet Take 1 tablet by mouth daily at 12 noon.  Marland Kitchen acetaminophen (TYLENOL) 500 MG tablet Take 500 mg by mouth every 6 (six) hours as needed for mild pain or headache.   . ibuprofen (ADVIL,MOTRIN) 600 MG tablet Take 1 tablet (600 mg total) by mouth every 6 (six) hours.  . magnesium oxide (MAG-OX) 400 (241.3 MG) MG tablet Take 0.5 tablets (200 mg total) by mouth daily. (Patient not taking: Reported on 12/25/2017)  . oxyCODONE-acetaminophen (PERCOCET/ROXICET) 5-325 MG tablet Take 1 tablet by mouth every 6 (six) hours as needed for moderate pain or severe pain. (Patient not taking: Reported on 12/25/2017)   No current facility-administered medications for this visit.  (Other)      REVIEW OF SYSTEMS: ROS    Positive for: Eyes   Negative for: Constitutional, Gastrointestinal, Neurological, Skin, Genitourinary, Musculoskeletal, HENT, Endocrine, Cardiovascular, Respiratory, Psychiatric, Allergic/Imm, Heme/Lymph   Last edited by Matthew Folks, COA on 06/26/2018 10:11 AM. (History)       ALLERGIES No Known Allergies  PAST MEDICAL HISTORY Past Medical History:  Diagnosis Date  . Acute blood loss anemia 12/17/2013  . Acute blood loss anemia 06/12/2015  . Fibroid    large 10cm  . Pelvic abscess in female 12/05/2017  . Postpartum care following vaginal delivery (3/29) 12/15/2013  . SVD (spontaneous vaginal delivery) 12/17/2013  . Vaginal Pap smear, abnormal    Past Surgical History:  Procedure Laterality  Date  . DILATION AND CURETTAGE OF UTERUS     2001/2004  . IR RADIOLOGIST EVAL & MGMT  12/14/2017  . IR RADIOLOGIST EVAL & MGMT  12/28/2017  . WISDOM TOOTH EXTRACTION      FAMILY HISTORY Family History  Problem Relation Age of Onset  . Hypertension Father   . Cancer Mother        breast  . Cancer Maternal Grandfather        pancreatic  . Cancer Paternal Grandmother        lymphoma  . Cancer Paternal Grandfather        prostate    SOCIAL HISTORY Social History    Tobacco Use  . Smoking status: Never Smoker  . Smokeless tobacco: Never Used  Substance Use Topics  . Alcohol use: Yes  . Drug use: No         OPHTHALMIC EXAM:  Base Eye Exam    Visual Acuity (Snellen - Linear)      Right Left   Dist cc 20/20 20/20   Correction:  Glasses       Tonometry (Tonopen, 10:10 AM)      Right Left   Pressure 13 14       Pupils      Dark Light Shape React APD   Right 5 5 Round No None   Left 5 5 Round No None  Pharm dilated OU       Visual Fields (Counting fingers)      Left Right    Full Full       Extraocular Movement      Right Left    Full, Ortho Full, Ortho       Neuro/Psych    Oriented x3:  Yes   Mood/Affect:  Normal       Dilation    Both eyes:  1.0% Mydriacyl, 2.5% Phenylephrine @ 10:10 AM        Slit Lamp and Fundus Exam    Slit Lamp Exam      Right Left   Lids/Lashes Normal Normal   Conjunctiva/Sclera White and quiet White and quiet   Cornea Clear Clear   Anterior Chamber Deep and quiet Deep and quiet   Iris Round and dilated Round and dilated   Lens Clear Clear   Vitreous Vitreous syneresis, no pigment Vitreous syneresis, no pigment       Fundus Exam      Right Left   Disc Pink and Sharp, mildly Tilted disc Pink and Sharp   C/D Ratio 0.3 0.3   Macula Flat, Good foveal reflex, Retinal pigment epithelial mottling Flat, Good foveal reflex, No heme or edema   Vessels Tortuous Tortuous   Periphery Attached, mild Lattice degeneration at 0600, oral bay tooth at 0300 Attached, mild Lattice degeneration at 0600, mild superior lattice at 1200, peripheral cystoid degeneration        Refraction    Wearing Rx      Sphere Cylinder Axis   Right -6.50 +1.50 090   Left -6.50 +2.00 102   Age:  5 yrs   Type:  SVL       Manifest Refraction      Sphere Cylinder Axis Dist VA   Right -6.50 +1.25 090 20/20   Left -6.50 +1.75 100 20/20          IMAGING AND PROCEDURES  Imaging and Procedures for @TODAY @  OCT,  Retina - OU - Both Eyes  Right Eye Quality was good. Central Foveal Thickness: 279. Progression has no prior data. Findings include normal foveal contour, no IRF, no SRF.   Left Eye Quality was good. Central Foveal Thickness: 275. Progression has no prior data. Findings include normal foveal contour, no IRF, no SRF.   Notes *Images captured and stored on drive  Diagnosis / Impression:  OD: NFP, No IRF/SRF OS: NFP, No IRF/SRF  Clinical management:  See below  Abbreviations: NFP - Normal foveal profile. CME - cystoid macular edema. PED - pigment epithelial detachment. IRF - intraretinal fluid. SRF - subretinal fluid. EZ - ellipsoid zone. ERM - epiretinal membrane. ORA - outer retinal atrophy. ORT - outer retinal tubulation. SRHM - subretinal hyper-reflective material                  ASSESSMENT/PLAN:    ICD-10-CM   1. Visual disturbance H53.9   2. 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 Retinal edema H35.81 OCT, Retina - OU - Both Eyes  5. 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 Visual disturbance OD - 3 episodes in last 3 wks of white-colored visual disturbance ("white-out") OD -- duration ~15 min - self resolve with no eye pain, flashes/floaters, or blurry vision - no ocular findings or abnormalities to correlate with symptoms -- no RT/RD - suspect acephalgic or ocular migraine  - discussed findings and differential diagnosis - if symptoms persist or become more frequent, would recommend Neurology consult for migraine work up - f/u prn  3. Lattice degeneration OU - mild lattice at 0600 OD - mild lattice at 0600 and 1200 OS - discussed findings, prognosis, and treatment options including observation - monitor - F/U 1 year  4. No retinal edema on exam or OCT  5. Myopia with astigmatism OU-  - under the expert management of [facility] - monitor  Ophthalmic Meds Ordered this visit:   No orders of the defined types were placed in this encounter.      Return in about 1 year (around 06/27/2019) for F/U lattice OU, DFE, OCT.  There are no Patient Instructions on file for this visit.   Explained the diagnoses, plan, and follow up with the patient and they expressed understanding.  Patient expressed understanding of the importance of proper follow up care.   This document serves as a record of services personally performed by Gardiner Sleeper, MD, PhD. It was created on their behalf by Catha Brow, [place], a certified ophthalmic assistant. The creation of this record is the provider's dictation and/or activities during the visit.  Electronically signed by: Catha Brow, COA  10.08.19 6:14 PM  Gardiner Sleeper, M.D., Ph.D. Diseases & Surgery of the Retina and Vitreous [facility]  I have reviewed the above documentation for accuracy and completeness, and I agree with the above. Gardiner Sleeper, M.D., Ph.D. 07/01/18 6:14 PM   Abbreviations: M myopia (nearsighted); A astigmatism; H hyperopia (farsighted); P presbyopia; Mrx spectacle prescription;  CTL contact lenses; OD right eye; OS left eye; OU both eyes  XT exotropia; ET esotropia; PEK punctate epithelial keratitis; PEE punctate epithelial erosions; DES dry eye syndrome; MGD meibomian gland dysfunction; ATs artificial tears; PFAT's preservative free artificial tears; [place] nuclear sclerotic cataract; PSC posterior subcapsular cataract; ERM epi-retinal membrane; PVD posterior vitreous detachment; RD retinal detachment; DM diabetes mellitus; DR diabetic retinopathy; NPDR non-proliferative diabetic retinopathy; PDR  proliferative diabetic retinopathy; CSME clinically significant macular edema; DME diabetic macular edema; dbh dot blot hemorrhages; CWS cotton wool spot; POAG primary open angle glaucoma; C/D cup-to-disc ratio; HVF humphrey visual field; GVF goldmann visual field; OCT optical coherence  tomography; IOP intraocular pressure; BRVO Branch retinal vein occlusion; CRVO central retinal vein occlusion; CRAO central retinal artery occlusion; BRAO branch retinal artery occlusion; RT retinal tear; SB scleral buckle; PPV pars plana vitrectomy; VH Vitreous hemorrhage; PRP panretinal laser photocoagulation; IVK intravitreal kenalog; VMT vitreomacular traction; MH Macular hole;  NVD neovascularization of the disc; NVE neovascularization elsewhere; AREDS age related eye disease study; ARMD age related macular degeneration; POAG primary open angle glaucoma; EBMD epithelial/anterior basement membrane dystrophy; ACIOL anterior chamber intraocular lens; IOL intraocular lens; PCIOL posterior chamber intraocular lens; Phaco/IOL phacoemulsification with intraocular lens placement; [place] photorefractive keratectomy; LASIK laser assisted in situ keratomileusis; HTN hypertension; DM diabetes mellitus; COPD chronic obstructive pulmonary disease

## 2018-07-01 ENCOUNTER — Encounter (INDEPENDENT_AMBULATORY_CARE_PROVIDER_SITE_OTHER): Payer: Self-pay | Admitting: Ophthalmology

## 2018-12-14 IMAGING — RF DG SINUS / FISTULA TRACT / ABSCESSOGRAM
5 series · 11 of 11 positions shown · non-contrast
Comparison: none

INDICATION: 36-year-old female with a history of postpartum pelvic abscess.

[Series 1: one shot · 1 of 1 slices shown (1 of 3)]
[im 1/1]
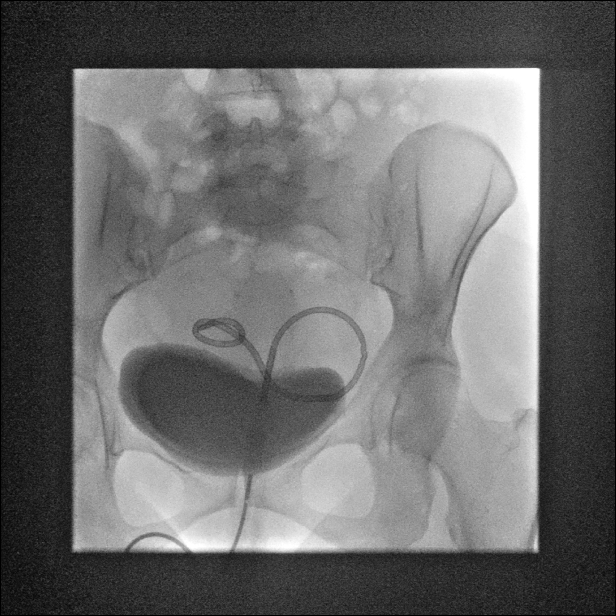

[Series 2: sequence · 4 of 50 frames shown (1 of 2)]
[frame 1/50]
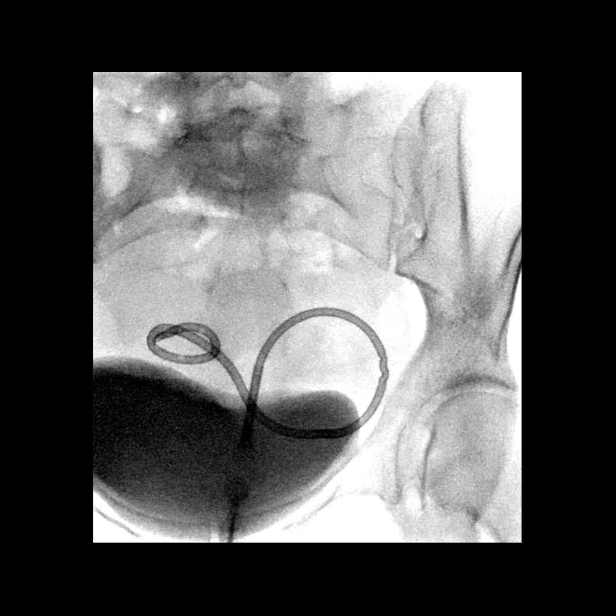
[frame 8/50]
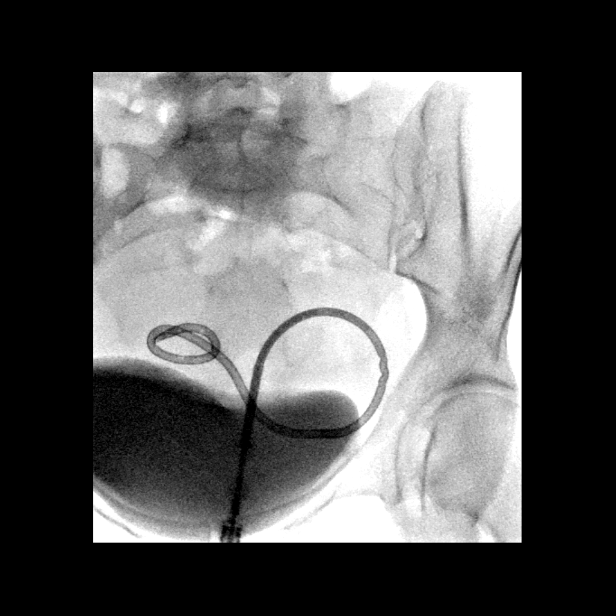
[frame 26/50]
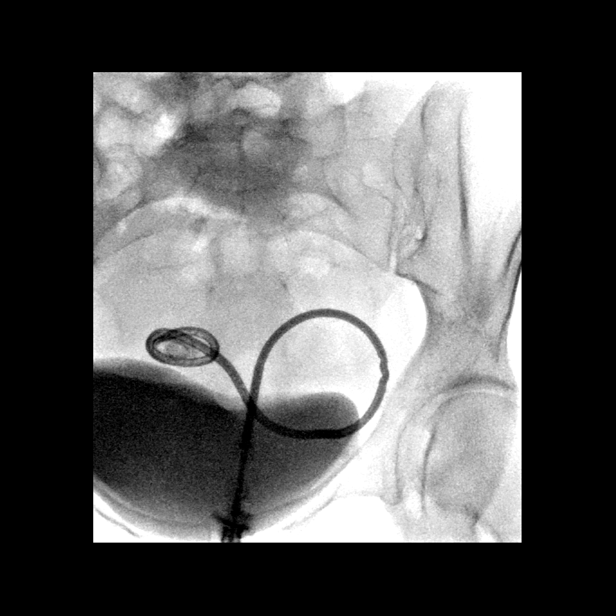
[frame 43/50]
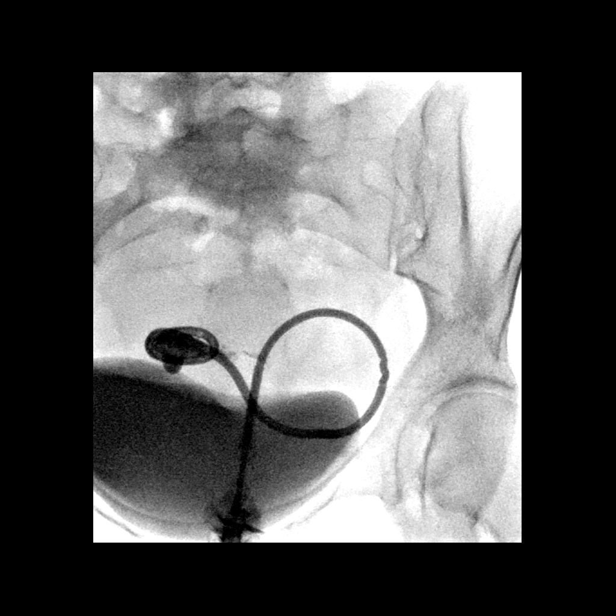

[Series 3: one shot · 1 of 1 slices shown (2 of 3)]
[im 1/1]
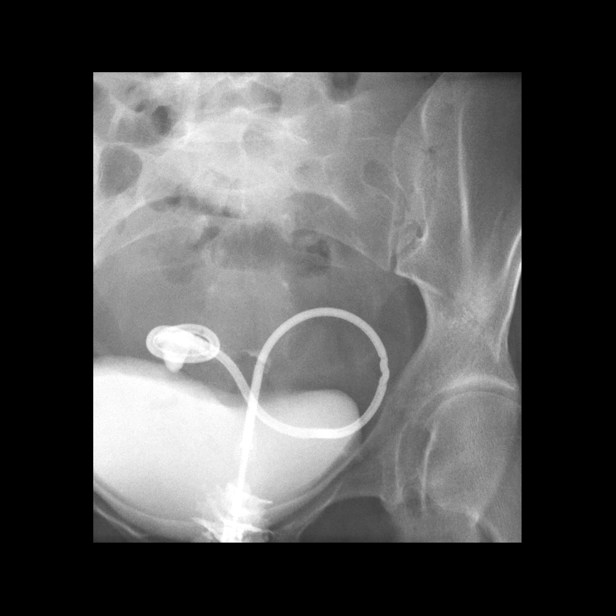

[Series 4: sequence · 3 of 15 frames shown (2 of 2)]
[frame 3/15]
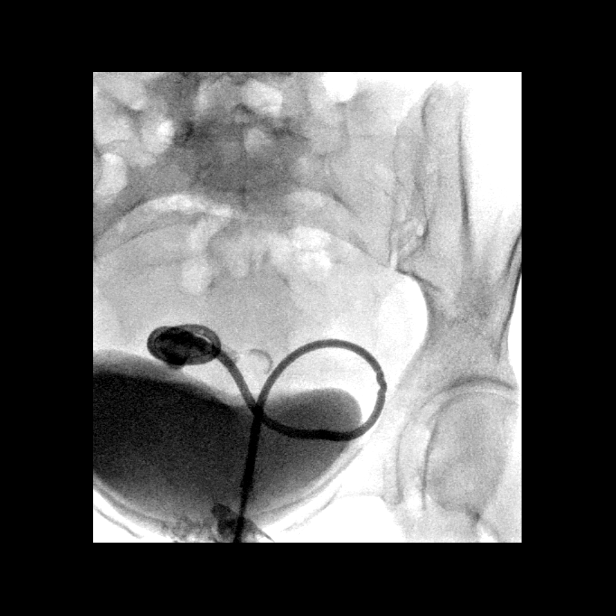
[frame 8/15]
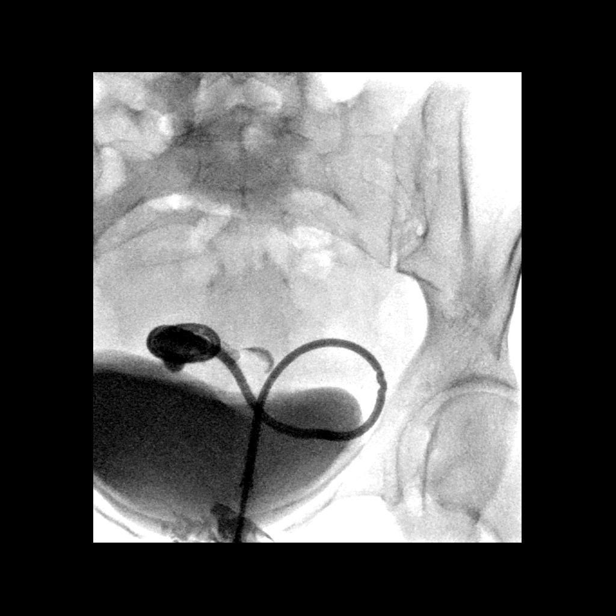
[frame 13/15]
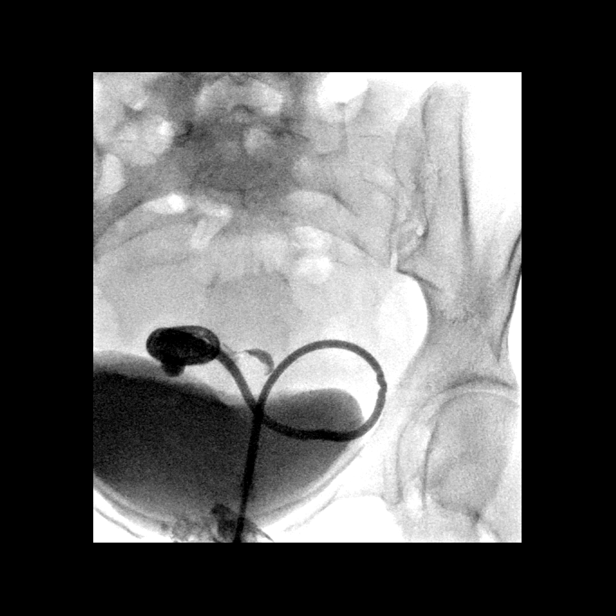

[Series 5: one shot · 2 of 2 slices shown (3 of 3)]
[im 1/2]
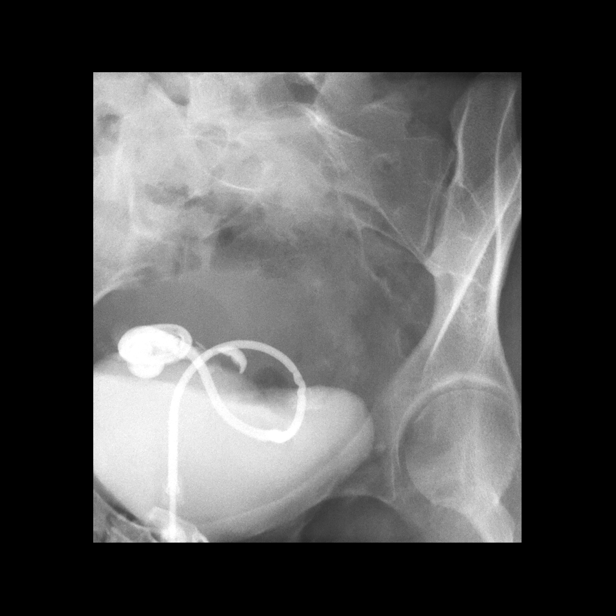
[im 2/2]
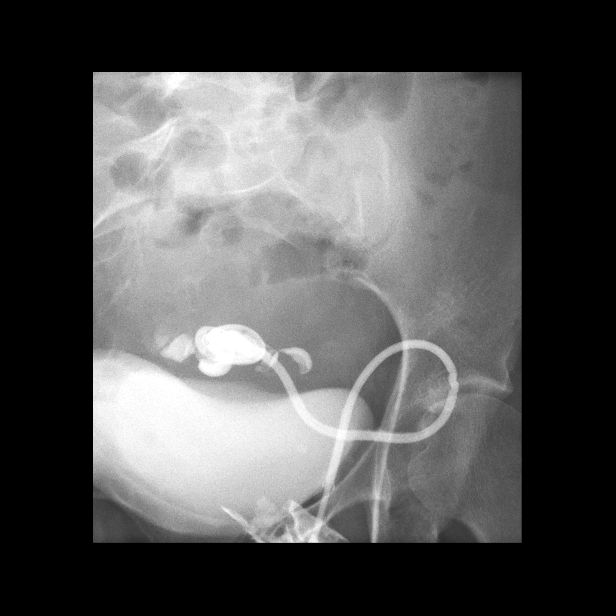

[11 of 11 positions shown; findings below may reference images not displayed]

EXAM:
THROUGH THE TUBE INJECTION

MEDICATIONS:
None

ANESTHESIA/SEDATION:
None

COMPLICATIONS:
None

PROCEDURE:
Informed written consent was obtained from the patient after a
thorough discussion of the procedural risks, benefits and
alternatives. All questions were addressed. Maximal Sterile Barrier
Technique was utilized including caps, mask, sterile gowns, sterile
gloves, sterile drape, hand hygiene and skin antiseptic. A timeout
was performed prior to the initiation of the procedure.

Patient was positioned supine position on the fluoroscopy table.
Drain injection was performed.

Patient tolerated the procedure well and remained hemodynamically
stable throughout.
FINDINGS: Drain injection confirms small residual abscess cavity in the
pelvis.

Drain was not removed at today's date.
IMPRESSION: Status post drain injection demonstrates small residual abscess
cavity.

PLAN:
Drain will stay in place on today's date.

Anticipate 2 week follow-up with drain injection only and no CT scan
given the patient's age. Today's study will serve as a baseline,
with maintained log of output.

## 2019-11-11 MED FILL — DROSPIR-ETH ESTRA 3/.02 MG: 3-0.02 | 84 days supply | Qty: 112 | Fill #0

## 2019-12-24 ENCOUNTER — Other Ambulatory Visit (HOSPITAL_COMMUNITY): Payer: Self-pay | Admitting: Physician Assistant

## 2020-01-22 ENCOUNTER — Other Ambulatory Visit (HOSPITAL_COMMUNITY): Payer: Self-pay | Admitting: Internal Medicine

## 2020-02-04 MED FILL — JASMIEL 3-0.02 MG TABS: 3-0.02 | 42 days supply | Qty: 56 | Fill #1

## 2020-02-05 MED FILL — busPIRone HCL 5 MG TABS: 5 | 45 days supply | Qty: 180 | Fill #0

## 2020-03-19 MED FILL — busPIRone HCL 5 MG TABS: 5 | 45 days supply | Qty: 180 | Fill #1

## 2020-04-01 MED FILL — JASMIEL 3-0.02 MG TABS: 3-0.02 | 63 days supply | Qty: 84 | Fill #0

## 2020-04-06 ENCOUNTER — Other Ambulatory Visit (HOSPITAL_COMMUNITY): Payer: Self-pay | Admitting: Obstetrics & Gynecology

## 2020-05-04 MED FILL — busPIRone HCL 5 MG TABS: 5 | 45 days supply | Qty: 180 | Fill #2

## 2020-06-10 MED FILL — JASMIEL 3-0.02 MG TABS: 3-0.02 | 84 days supply | Qty: 112 | Fill #0

## 2020-06-19 MED FILL — busPIRone HCL 5 MG TABS: 5 | 90 days supply | Qty: 180 | Fill #3

## 2020-06-23 ENCOUNTER — Encounter (INDEPENDENT_AMBULATORY_CARE_PROVIDER_SITE_OTHER): Payer: No Typology Code available for payment source | Admitting: Ophthalmology

## 2020-06-30 NOTE — Progress Notes (Signed)
[facility] Note  07/01/2020     CHIEF COMPLAINT Patient presents for Retina Follow Up   HISTORY OF PRESENT ILLNESS: Sydney Little is a 39 y.o. female who presents to the clinic today for:   HPI    Retina Follow Up    Patient presents with  Other.  In both eyes.  Duration of 2 years.  Since onset it is stable.  I, the attending physician,  performed the HPI with the patient and updated documentation appropriately.          Comments    2 year follow up visual disturbance- Doing well.  Pt states she has not had any of the issues that she had come in for.  Vision appears stable.  Denies using eye drops.        Last edited by Bernarda Caffey, MD on 07/02/2020 11:50 PM. (History)    Pt states vision has been stable since last visit, pt states she gets migraines, but her BP is usually pretty good, she states she takes continuous birth control to help control hormones and that has helped with the migraines, pt goes to [facility] for her routine eye care  Referring physician: Debbra Riding, MD [address] [place],  [place] [address]  HISTORICAL INFORMATION:   Selected notes from the [facility] Referred by Dr. Zenia Resides for retinal hole    CURRENT MEDICATIONS: No current outpatient medications on file. (Ophthalmic Drugs)   No current facility-administered medications for this visit. (Ophthalmic Drugs)   Current Outpatient Medications (Other)  Medication Sig  . acetaminophen (TYLENOL) 500 MG tablet Take 500 mg by mouth every 6 (six) hours as needed for mild pain or headache.   . busPIRone (BUSPAR) 10 MG tablet Take 10 mg by mouth 3 (three) times daily.  Marland Kitchen docusate sodium (COLACE) 50 MG capsule Take 50 mg by mouth daily.  . drospirenone-ethinyl estradiol (YAZ) 3-0.02 MG tablet Take 1 tablet by mouth daily.  Marland Kitchen ibuprofen (ADVIL,MOTRIN) 600 MG tablet Take 1 tablet (600 mg total) by mouth every 6 (six) hours.  . Prenatal Vit-Fe  Fumarate-FA (MULTIVITAMIN-PRENATAL) 27-0.8 MG TABS tablet Take 1 tablet by mouth daily at 12 noon.  . magnesium oxide (MAG-OX) 400 (241.3 MG) MG tablet Take 0.5 tablets (200 mg total) by mouth daily. (Patient not taking: Reported on 12/25/2017)  . oxyCODONE-acetaminophen (PERCOCET/ROXICET) 5-325 MG tablet Take 1 tablet by mouth every 6 (six) hours as needed for moderate pain or severe pain. (Patient not taking: Reported on 12/25/2017)   No current facility-administered medications for this visit. (Other)      REVIEW OF SYSTEMS: ROS    Positive for: Genitourinary, Eyes   Negative for: Constitutional, Gastrointestinal, Neurological, Skin, Musculoskeletal, HENT, Endocrine, Cardiovascular, Respiratory, Psychiatric, Allergic/Imm, Heme/Lymph   Last edited by Leonie Douglas, COA on 07/01/2020  8:16 AM. (History)       ALLERGIES No Known Allergies  PAST MEDICAL HISTORY Past Medical History:  Diagnosis Date  . Acute blood loss anemia 12/17/2013  . Acute blood loss anemia 06/12/2015  . Fibroid    large 10cm  . Pelvic abscess in female 12/05/2017  . Postpartum care following vaginal delivery (3/29) 12/15/2013  . SVD (spontaneous vaginal delivery) 12/17/2013  . Vaginal Pap smear, abnormal    Past Surgical History:  Procedure Laterality Date  . DILATION AND CURETTAGE OF UTERUS     2001/2004  . IR RADIOLOGIST EVAL & MGMT  12/14/2017  .  IR RADIOLOGIST EVAL & MGMT  12/28/2017  . WISDOM TOOTH EXTRACTION      FAMILY HISTORY Family History  Problem Relation Age of Onset  . Hypertension Father   . Cancer Mother        breast  . Cancer Maternal Grandfather        pancreatic  . Cancer Paternal Grandmother        lymphoma  . Cancer Paternal Grandfather        prostate    SOCIAL HISTORY Social History   Tobacco Use  . Smoking status: Never Smoker  . Smokeless tobacco: Never Used  Substance Use Topics  . Alcohol use: Yes  . Drug use: No         OPHTHALMIC EXAM:  Base Eye Exam     Visual Acuity (Snellen - Linear)      Right Left   Dist cc 20/20 20/20   Correction: Contacts       Tonometry (Tonopen, 8:23 AM)      Right Left   Pressure 13 10       Pupils      Dark Light Shape React APD   Right 4 3 Round Brisk None   Left 4 3 Round Brisk None       Visual Fields (Counting fingers)      Left Right    Full Full       Extraocular Movement      Right Left    Full Full       Neuro/Psych    Oriented x3: Yes   Mood/Affect: Normal       Dilation    Both eyes: 1.0% Mydriacyl, 2.5% Phenylephrine @ 8:23 AM        Slit Lamp and Fundus Exam    Slit Lamp Exam      Right Left   Lids/Lashes Normal Normal   Conjunctiva/Sclera White and quiet White and quiet   Cornea Clear Trace inferior Punctate epithelial erosions   Anterior Chamber Deep and quiet Deep and quiet   Iris Round and dilated Round and dilated   Lens Clear Clear   Vitreous trace Vitreous syneresis, no pigment trace Vitreous syneresis, no pigment       Fundus Exam      Right Left   Disc Pink and Sharp, mildly Tilted disc Pink and Sharp   C/D Ratio 0.3 0.3   Macula Flat, Good foveal reflex, Retinal pigment epithelial mottling, No heme or edema Flat, Good foveal reflex, No heme or edema   Vessels mild tortuousity, mild AV crossing changes Tortuous   Periphery Attached, mild Lattice degeneration at 0600 Attached, mild Lattice degeneration at 0600, mild superior lattice at 1200, peripheral cystoid degeneration          IMAGING AND PROCEDURES  Imaging and Procedures for @TODAY @  OCT, Retina - OU - Both Eyes       Right Eye Quality was good. Central Foveal Thickness: 275. Progression has been stable. Findings include normal foveal contour, no IRF, no SRF.   Left Eye Quality was good. Central Foveal Thickness: 269. Progression has been stable. Findings include normal foveal contour, no IRF, no SRF.   Notes *Images captured and stored on drive  Diagnosis / Impression:  OD: NFP, No  IRF/SRF OS: NFP, No IRF/SRF  Clinical management:  See below  Abbreviations: NFP - Normal foveal profile. CME - cystoid macular edema. PED - pigment epithelial detachment. IRF - intraretinal fluid. SRF - subretinal fluid. EZ -  ellipsoid zone. ERM - epiretinal membrane. ORA - outer retinal atrophy. ORT - outer retinal tubulation. SRHM - subretinal hyper-reflective material                  ASSESSMENT/PLAN:    ICD-10-CM   1. Visual disturbance  H53.9   2. Ocular migraine  G43.109   3. Lattice degeneration of both retinas  H35.413   4. Retinal edema  H35.81 OCT, Retina - OU - Both Eyes  5. Myopia of both eyes with astigmatism  H52.13    H52.203     1,2. History of visual disturbance OD  - 3 episodes in 2019 of white-colored visual disturbance ("white-out") OD -- duration ~15 min  - self resolve with no eye pain, flashes/floaters, or blurry vision  - no ocular findings or abnormalities to correlate with symptoms -- no RT/RD  - suspect acephalgic or ocular migraine   - pt reports no recurrences of episodes  - monitor  - f/u here prn  3. Lattice degeneration OU  - mild lattice at 0600 OD  - mild lattice at 0600 and 1200 OS  - discussed findings, prognosis, and treatment options including observation  - monitor  - pt is cleared from a retina standpoint for release to Dr. Katy Fitch and resumption of primary eye care  - f/u here prn  4. No retinal edema on exam or OCT  5. Myopia with astigmatism OU-   - under the expert management of [facility]  - monitor  Ophthalmic Meds Ordered this visit:  No orders of the defined types were placed in this encounter.      Return if symptoms worsen or fail to improve.  There are no Patient Instructions on file for this visit.   Explained the diagnoses, plan, and follow up with the patient and they expressed understanding.  Patient expressed understanding of the importance of proper follow up care.   This document serves as a  record of services personally performed by Gardiner Sleeper, MD, PhD. It was created on their behalf by Roselee Nova, COMT. The creation of this record is the provider's dictation and/or activities during the visit.  Electronically signed by: Roselee Nova, COMT 07/02/20 11:54 PM   This document serves as a record of services personally performed by Gardiner Sleeper, MD, PhD. It was created on their behalf by San Jetty. Owens Shark, OA an ophthalmic technician. The creation of this record is the provider's dictation and/or activities during the visit.    Electronically signed by: San Jetty. Owens Shark, [place] 10.13.2021 11:54 PM  Gardiner Sleeper, M.D., Ph.D. Diseases & Surgery of the Retina and Vitreous [facility]  I have reviewed the above documentation for accuracy and completeness, and I agree with the above. Gardiner Sleeper, M.D., Ph.D. 07/02/20 11:54 PM   Abbreviations: M myopia (nearsighted); A astigmatism; H hyperopia (farsighted); P presbyopia; Mrx spectacle prescription;  CTL contact lenses; OD right eye; OS left eye; OU both eyes  XT exotropia; ET esotropia; PEK punctate epithelial keratitis; PEE punctate epithelial erosions; DES dry eye syndrome; MGD meibomian gland dysfunction; ATs artificial tears; PFAT's preservative free artificial tears; [place] nuclear sclerotic cataract; PSC posterior subcapsular cataract; ERM epi-retinal membrane; PVD posterior vitreous detachment; RD retinal detachment; DM diabetes mellitus; DR diabetic retinopathy; NPDR non-proliferative diabetic retinopathy; PDR proliferative diabetic retinopathy; CSME clinically significant macular edema; DME diabetic macular edema; dbh dot blot hemorrhages; CWS cotton wool spot; POAG primary open angle glaucoma; C/D cup-to-disc  ratio; HVF humphrey visual field; GVF goldmann visual field; OCT optical coherence tomography; IOP intraocular pressure; BRVO Branch retinal vein occlusion; CRVO central retinal vein occlusion; CRAO  central retinal artery occlusion; BRAO branch retinal artery occlusion; RT retinal tear; SB scleral buckle; PPV pars plana vitrectomy; VH Vitreous hemorrhage; PRP panretinal laser photocoagulation; IVK intravitreal kenalog; VMT vitreomacular traction; MH Macular hole;  NVD neovascularization of the disc; NVE neovascularization elsewhere; AREDS age related eye disease study; ARMD age related macular degeneration; POAG primary open angle glaucoma; EBMD epithelial/anterior basement membrane dystrophy; ACIOL anterior chamber intraocular lens; IOL intraocular lens; PCIOL posterior chamber intraocular lens; Phaco/IOL phacoemulsification with intraocular lens placement; [place] photorefractive keratectomy; LASIK laser assisted in situ keratomileusis; HTN hypertension; DM diabetes mellitus; COPD chronic obstructive pulmonary disease

## 2020-07-01 ENCOUNTER — Ambulatory Visit (INDEPENDENT_AMBULATORY_CARE_PROVIDER_SITE_OTHER): Payer: No Typology Code available for payment source | Admitting: Ophthalmology

## 2020-07-01 ENCOUNTER — Other Ambulatory Visit: Payer: Self-pay

## 2020-07-01 DIAGNOSIS — H539 Unspecified visual disturbance: Secondary | ICD-10-CM

## 2020-07-01 DIAGNOSIS — H35413 Lattice degeneration of retina, bilateral: Secondary | ICD-10-CM | POA: Diagnosis not present

## 2020-07-01 DIAGNOSIS — H5213 Myopia, bilateral: Secondary | ICD-10-CM

## 2020-07-01 DIAGNOSIS — H3581 Retinal edema: Secondary | ICD-10-CM

## 2020-07-01 DIAGNOSIS — G43109 Migraine with aura, not intractable, without status migrainosus: Secondary | ICD-10-CM

## 2020-07-01 DIAGNOSIS — H52203 Unspecified astigmatism, bilateral: Secondary | ICD-10-CM

## 2020-07-02 ENCOUNTER — Encounter (INDEPENDENT_AMBULATORY_CARE_PROVIDER_SITE_OTHER): Payer: Self-pay | Admitting: Ophthalmology

## 2020-08-07 ENCOUNTER — Other Ambulatory Visit (HOSPITAL_COMMUNITY): Payer: Self-pay | Admitting: Internal Medicine

## 2020-08-07 MED FILL — busPIRone HCL 5 MG TABS: 5 | 15 days supply | Qty: 60 | Fill #0

## 2020-08-18 ENCOUNTER — Other Ambulatory Visit: Payer: Self-pay

## 2020-08-18 ENCOUNTER — Ambulatory Visit (INDEPENDENT_AMBULATORY_CARE_PROVIDER_SITE_OTHER): Payer: No Typology Code available for payment source | Admitting: Podiatry

## 2020-08-18 ENCOUNTER — Encounter: Payer: Self-pay | Admitting: Podiatry

## 2020-08-18 DIAGNOSIS — Q828 Other specified congenital malformations of skin: Secondary | ICD-10-CM

## 2020-08-18 NOTE — Progress Notes (Signed)
Subjective:  Patient ID: Sydney Little, female    DOB: 01/08/1981,  MRN: [id number]  Chief Complaint  Patient presents with  . Callouses    Patient presents today for painful callous lesion bottom of left forefoot which she noticed 1 week ago.  She says it bothers her with direct pressure, but does not hurt all the time   No treatment has been done    39 y.o. female presents with the above complaint.  Patient presents with benign skin lesion left side submetatarsal 2 pain.  Patient states it painful with direct pressure does not hurt all the time.  No treatment has been done.  She was to get it evaluated make sure that there is not a wart or any other skin lesion.  She works for [employer] at [employer].  She denies any other acute complaints.  She would like to discuss treatment options for this lesion.   Review of Systems: Negative except as noted in the HPI. Denies N/V/F/Ch.  Past Medical History:  Diagnosis Date  . Acute blood loss anemia 12/17/2013  . Acute blood loss anemia 06/12/2015  . Fibroid    large 10cm  . Pelvic abscess in female 12/05/2017  . Postpartum care following vaginal delivery (3/29) 12/15/2013  . SVD (spontaneous vaginal delivery) 12/17/2013  . Vaginal Pap smear, abnormal     Current Outpatient Medications:  .  acetaminophen (TYLENOL) 500 MG tablet, Take 500 mg by mouth every 6 (six) hours as needed for mild pain or headache. , Disp: , Rfl:  .  busPIRone (BUSPAR) 10 MG tablet, Take 10 mg by mouth 3 (three) times daily., Disp: , Rfl:  .  docusate sodium (COLACE) 50 MG capsule, Take 50 mg by mouth daily., Disp: , Rfl:  .  drospirenone-ethinyl estradiol (YAZ) 3-0.02 MG tablet, Take 1 tablet by mouth daily., Disp: , Rfl:  .  ibuprofen (ADVIL,MOTRIN) 600 MG tablet, Take 1 tablet (600 mg total) by mouth every 6 (six) hours., Disp: 30 tablet, Rfl: 0 .  Prenatal Vit-Fe Fumarate-FA (MULTIVITAMIN-PRENATAL) 27-0.8 MG TABS tablet, Take 1 tablet by mouth daily at 12  noon., Disp: , Rfl:   Social History   Tobacco Use  Smoking Status Never Smoker  Smokeless Tobacco Never Used    No Known Allergies Objective:  There were no vitals filed for this visit. There is no height or weight on file to calculate BMI. Constitutional Well developed. Well nourished.  Vascular Dorsalis pedis pulses palpable bilaterally. Posterior tibial pulses palpable bilaterally. Capillary refill normal to all digits.  No cyanosis or clubbing noted. Pedal hair growth normal.  Neurologic Normal speech. Oriented to person, place, and time. Epicritic sensation to light touch grossly present bilaterally.  Dermatologic  hyperkeratotic lesion noted to left submetatarsal 2 with central nucleated core.  Pain on palpation to the lesion.  Orthopedic: Normal joint ROM without pain or crepitus bilaterally. No visible deformities. No bony tenderness.   Radiographs: None Assessment:   1. Porokeratosis    Plan:  Patient was evaluated and treated and all questions answered.  Left submetatarsal 2 porokeratosis/benign skin lesion -Patient I explained to the patient the etiology of the porokeratosis and various treatment options discussed.  I discussed with her that porokeratosis are resistant to treatment.  I believe patient will benefit from aggressive debridement of the lesion with excision of the central nucleated core.  I believe she will also benefit from offloading pad as well. -Offloading pad was dispensed. -If there is no improvement  we will discuss doing Cantharone therapy versus offloading with orthotics versus steroid injection.  No follow-ups on file.

## 2020-08-24 MED FILL — busPIRone HCL 5 MG TABS: 5 | 45 days supply | Qty: 180 | Fill #0

## 2020-08-26 ENCOUNTER — Other Ambulatory Visit: Payer: Self-pay

## 2020-08-26 ENCOUNTER — Ambulatory Visit (INDEPENDENT_AMBULATORY_CARE_PROVIDER_SITE_OTHER): Payer: No Typology Code available for payment source | Admitting: Podiatry

## 2020-08-26 DIAGNOSIS — Q828 Other specified congenital malformations of skin: Secondary | ICD-10-CM

## 2020-08-26 DIAGNOSIS — M7752 Other enthesopathy of left foot: Secondary | ICD-10-CM

## 2020-08-26 DIAGNOSIS — L989 Disorder of the skin and subcutaneous tissue, unspecified: Secondary | ICD-10-CM

## 2020-08-28 ENCOUNTER — Encounter: Payer: Self-pay | Admitting: Podiatry

## 2020-08-28 DIAGNOSIS — M7752 Other enthesopathy of left foot: Secondary | ICD-10-CM | POA: Diagnosis not present

## 2020-08-28 MED ORDER — TRIAMCINOLONE ACETONIDE 10 MG/ML IJ SUSP
10.0000 mg | Freq: Once | INTRAMUSCULAR | Status: AC
  Administered 2020-08-28: 10 mg via INTRA_ARTICULAR

## 2020-08-28 NOTE — Progress Notes (Signed)
Subjective:  Patient ID: Sydney Little, female    DOB: 10/04/1980,  MRN: [id number]  Chief Complaint  Patient presents with  . Foot Pain    left foot pain in the middle of the foot. PT stated it is painful when she applies pressure    39 y.o. female presents with the above complaint.  Patient presents with benign skin lesion left side submetatarsal 2 pain.  Patient states it painful with direct pressure does not hurt all the time.  No treatment has been done.  She was to get it evaluated make sure that there is not a wart or any other skin lesion.  She works for [employer] at [employer].  She denies any other acute complaints.  She would like to discuss treatment options for this lesion.   Review of Systems: Negative except as noted in the HPI. Denies N/V/F/Ch.  Past Medical History:  Diagnosis Date  . Acute blood loss anemia 12/17/2013  . Acute blood loss anemia 06/12/2015  . Fibroid    large 10cm  . Pelvic abscess in female 12/05/2017  . Postpartum care following vaginal delivery (3/29) 12/15/2013  . SVD (spontaneous vaginal delivery) 12/17/2013  . Vaginal Pap smear, abnormal     Current Outpatient Medications:  .  acetaminophen (TYLENOL) 500 MG tablet, Take 500 mg by mouth every 6 (six) hours as needed for mild pain or headache. , Disp: , Rfl:  .  busPIRone (BUSPAR) 10 MG tablet, Take 10 mg by mouth 3 (three) times daily., Disp: , Rfl:  .  busPIRone (BUSPAR) 5 MG tablet, Take 10 mg by mouth 2 (two) times daily., Disp: , Rfl:  .  docusate sodium (COLACE) 50 MG capsule, Take 50 mg by mouth daily., Disp: , Rfl:  .  drospirenone-ethinyl estradiol (YAZ) 3-0.02 MG tablet, Take 1 tablet by mouth daily., Disp: , Rfl:  .  ibuprofen (ADVIL,MOTRIN) 600 MG tablet, Take 1 tablet (600 mg total) by mouth every 6 (six) hours., Disp: 30 tablet, Rfl: 0 .  Prenatal Vit-Fe Fumarate-FA (MULTIVITAMIN-PRENATAL) 27-0.8 MG TABS tablet, Take 1 tablet by mouth daily at 12 noon., Disp: , Rfl:    Social History   Tobacco Use  Smoking Status Never Smoker  Smokeless Tobacco Never Used    No Known Allergies Objective:  There were no vitals filed for this visit. There is no height or weight on file to calculate BMI. Constitutional Well developed. Well nourished.  Vascular Dorsalis pedis pulses palpable bilaterally. Posterior tibial pulses palpable bilaterally. Capillary refill normal to all digits.  No cyanosis or clubbing noted. Pedal hair growth normal.  Neurologic Normal speech. Oriented to person, place, and time. Epicritic sensation to light touch grossly present bilaterally.  Dermatologic  hyperkeratotic lesion noted to left submetatarsal 2 with central nucleated core.  Pain on palpation to the lesion.  Orthopedic: Normal joint ROM without pain or crepitus bilaterally. No visible deformities. No bony tenderness.   Radiographs: None Assessment:   1. Benign skin lesion   2. Capsulitis of metatarsophalangeal (MTP) joint of left foot   3. Porokeratosis    Plan:  Patient was evaluated and treated and all questions answered.  Left submetatarsal 2 porokeratosis/benign skin lesion with underlying capsulitis -Patient I explained to the patient the etiology of the porokeratosis and various treatment options discussed.  I discussed with her that porokeratosis are resistant to treatment.  Debrided the lesion down to healthy striated tissue followed by application of Cantharone therapy to destroy the lesion.  Also  believe she will benefit from a steroid injection to help decrease acute inflammatory component associate with the lesion.  Patient agrees with the plan like to proceed with a steroid injection. A steroid injection was performed at left submetatarsal 2 using 1% plain Lidocaine and 10 mg of Kenalog. This was well tolerated.  -Offloading pad was dispensed. -Cantharone therapy was applied in standard technique..  No follow-ups on file.

## 2020-09-01 ENCOUNTER — Other Ambulatory Visit (HOSPITAL_COMMUNITY): Payer: Self-pay | Admitting: Registered Nurse

## 2020-09-01 MED FILL — AZITHROMYCIN 250 MG TABLET: 250 | 5 days supply | Qty: 6 | Fill #0

## 2020-09-01 MED FILL — XOFLUZA 40 (2) MG TBPK: 2 X 40 | 1 days supply | Qty: 2 | Fill #0

## 2020-09-09 ENCOUNTER — Other Ambulatory Visit: Payer: Self-pay

## 2020-09-09 ENCOUNTER — Ambulatory Visit (INDEPENDENT_AMBULATORY_CARE_PROVIDER_SITE_OTHER): Payer: No Typology Code available for payment source | Admitting: Podiatry

## 2020-09-09 DIAGNOSIS — L989 Disorder of the skin and subcutaneous tissue, unspecified: Secondary | ICD-10-CM | POA: Diagnosis not present

## 2020-09-09 MED FILL — JASMIEL 3-0.02 MG TABS: 3-0.02 | 84 days supply | Qty: 112 | Fill #1

## 2020-09-10 ENCOUNTER — Encounter: Payer: Self-pay | Admitting: Podiatry

## 2020-09-10 NOTE — Progress Notes (Signed)
Subjective:  Patient ID: Sydney Little, female    DOB: 1981/04/30,  MRN: [id number]  Chief Complaint  Patient presents with  . Foot Pain    Pt stated that she is doing okay she still has some pain but not as bad as the last time.     39 y.o. female presents with the above complaint.  Patient presents with benign skin lesion left side submetatarsal 2 pain.  Patient states it painful with direct pressure does not hurt all the time.  We have been doing Cantharone therapy.  Today will be her second treatment option.  She is improving overall.  She works at [employer] long.   Review of Systems: Negative except as noted in the HPI. Denies N/V/F/Ch.  Past Medical History:  Diagnosis Date  . Acute blood loss anemia 12/17/2013  . Acute blood loss anemia 06/12/2015  . Fibroid    large 10cm  . Pelvic abscess in female 12/05/2017  . Postpartum care following vaginal delivery (3/29) 12/15/2013  . SVD (spontaneous vaginal delivery) 12/17/2013  . Vaginal Pap smear, abnormal     Current Outpatient Medications:  .  acetaminophen (TYLENOL) 500 MG tablet, Take 500 mg by mouth every 6 (six) hours as needed for mild pain or headache. , Disp: , Rfl:  .  busPIRone (BUSPAR) 10 MG tablet, Take 10 mg by mouth 3 (three) times daily., Disp: , Rfl:  .  busPIRone (BUSPAR) 5 MG tablet, Take 10 mg by mouth 2 (two) times daily., Disp: , Rfl:  .  docusate sodium (COLACE) 50 MG capsule, Take 50 mg by mouth daily., Disp: , Rfl:  .  drospirenone-ethinyl estradiol (YAZ) 3-0.02 MG tablet, Take 1 tablet by mouth daily., Disp: , Rfl:  .  ibuprofen (ADVIL,MOTRIN) 600 MG tablet, Take 1 tablet (600 mg total) by mouth every 6 (six) hours., Disp: 30 tablet, Rfl: 0 .  Prenatal Vit-Fe Fumarate-FA (MULTIVITAMIN-PRENATAL) 27-0.8 MG TABS tablet, Take 1 tablet by mouth daily at 12 noon., Disp: , Rfl:   Social History   Tobacco Use  Smoking Status Never Smoker  Smokeless Tobacco Never Used    No Known  Allergies Objective:  There were no vitals filed for this visit. There is no height or weight on file to calculate BMI. Constitutional Well developed. Well nourished.  Vascular Dorsalis pedis pulses palpable bilaterally. Posterior tibial pulses palpable bilaterally. Capillary refill normal to all digits.  No cyanosis or clubbing noted. Pedal hair growth normal.  Neurologic Normal speech. Oriented to person, place, and time. Epicritic sensation to light touch grossly present bilaterally.  Dermatologic  hyperkeratotic lesion noted to left submetatarsal 2 with central nucleated core.  Pain on palpation to the lesion.  Orthopedic: Normal joint ROM without pain or crepitus bilaterally. No visible deformities. No bony tenderness.   Radiographs: None Assessment:   1. Benign skin lesion    Plan:  Patient was evaluated and treated and all questions answered.  Left submetatarsal 2 porokeratosis/benign skin lesion with underlying capsulitis -Patient I explained to the patient the etiology of the porokeratosis and various treatment options discussed.  I discussed with her that porokeratosis are resistant to treatment.  Debrided the lesion down to healthy striated tissue followed by application of Cantharone therapy to destroy the lesion.  Also believe she will benefit from a steroid injection to help decrease acute inflammatory component associate with the lesion.  Patient agrees with the plan like to proceed with a steroid injection. -I will hold off on any  further steroid injection. -Offloading pad was dispensed. -Cantharone therapy was applied in standard technique second application  No follow-ups on file.

## 2020-09-15 ENCOUNTER — Telehealth: Payer: Self-pay

## 2020-09-15 ENCOUNTER — Ambulatory Visit: Payer: No Typology Code available for payment source | Admitting: Podiatry

## 2020-09-15 NOTE — Telephone Encounter (Signed)
Patient left a message today at 3:17. She had cantherone treatment of her skin lesion on 09/09/2020. Since Sunday 09/13/2020 she has been unable to walk on her foot because it is so painful, she is even using crutches. She is upset because she was not under the impression that it would hurt this badly. Please call to advise Thanks

## 2020-09-16 ENCOUNTER — Ambulatory Visit: Payer: No Typology Code available for payment source | Admitting: Podiatry

## 2020-09-17 DIAGNOSIS — M79676 Pain in unspecified toe(s): Secondary | ICD-10-CM

## 2020-10-07 ENCOUNTER — Other Ambulatory Visit: Payer: Self-pay

## 2020-10-07 ENCOUNTER — Encounter: Payer: Self-pay | Admitting: Podiatry

## 2020-10-07 ENCOUNTER — Ambulatory Visit (INDEPENDENT_AMBULATORY_CARE_PROVIDER_SITE_OTHER): Payer: No Typology Code available for payment source | Admitting: Podiatry

## 2020-10-07 DIAGNOSIS — L989 Disorder of the skin and subcutaneous tissue, unspecified: Secondary | ICD-10-CM | POA: Diagnosis not present

## 2020-10-07 DIAGNOSIS — Q828 Other specified congenital malformations of skin: Secondary | ICD-10-CM | POA: Diagnosis not present

## 2020-10-07 NOTE — Progress Notes (Signed)
Subjective:  Patient ID: Sydney Little, female    DOB: [birth year],  MRN: [id number]  Chief Complaint  Patient presents with   Foot Pain    In the center of foot Pt has a skin lesion that she stated is doing much better than before she has no major concerns at this time.     40 y.o. female presents with the above complaint.  Patient presents with follow-up of benign skin lesion/porokeratosis left submetatarsal 2.  She states she is no longer in pain.  She states the [place] helped a lot.  She states that there was some pain with Cantharone but overall now doing much better..   Review of Systems: Negative except as noted in the HPI. Denies N/V/F/Ch.  Past Medical History:  Diagnosis Date   Acute blood loss anemia 12/17/2013   Acute blood loss anemia 06/12/2015   Fibroid    large 10cm   Pelvic abscess in female 12/05/2017   Postpartum care following vaginal delivery (3/29) 12/15/2013   SVD (spontaneous vaginal delivery) 12/17/2013   Vaginal Pap smear, abnormal     Current Outpatient Medications:    Na Sulfate-K Sulfate-Mg Sulf (SUPREP BOWEL PREP KIT) 17.5-3.13-1.6 GM/177ML SOLN, See admin instructions., Disp: , Rfl:    acetaminophen (TYLENOL) 500 MG tablet, Take 500 mg by mouth every 6 (six) hours as needed for mild pain or headache. , Disp: , Rfl:    ALPRAZolam (XANAX) 0.25 MG tablet, , Disp: , Rfl:    azithromycin (ZITHROMAX) 250 MG tablet, Take by mouth., Disp: , Rfl:    busPIRone (BUSPAR) 10 MG tablet, Take 10 mg by mouth 3 (three) times daily., Disp: , Rfl:    busPIRone (BUSPAR) 5 MG tablet, Take 10 mg by mouth 2 (two) times daily., Disp: , Rfl:    docusate sodium (COLACE) 50 MG capsule, Take 50 mg by mouth daily., Disp: , Rfl:    drospirenone-ethinyl estradiol (YAZ) 3-0.02 MG tablet, Take 1 tablet by mouth daily., Disp: , Rfl:    ibuprofen (ADVIL,MOTRIN) 600 MG tablet, Take 1 tablet (600 mg total) by mouth every 6 (six) hours., Disp: 30 tablet, Rfl: 0    Melatonin 1 MG/4ML LIQD, , Disp: , Rfl:    Multiple Vitamins-Minerals (MULTIVITAMIN ADULT EXTRA C PO), , Disp: , Rfl:    Prenatal Vit-Fe Fumarate-FA (MULTIVITAMIN-PRENATAL) 27-0.8 MG TABS tablet, Take 1 tablet by mouth daily at 12 noon., Disp: , Rfl:    SUMAtriptan (IMITREX) 100 MG tablet, , Disp: , Rfl:    XOFLUZA, 80 MG DOSE, 2 x 40 MG TBPK, Take by mouth once., Disp: , Rfl:   Social History   Tobacco Use  Smoking Status Never Smoker  Smokeless Tobacco Never Used    No Known Allergies Objective:  There were no vitals filed for this visit. There is no height or weight on file to calculate BMI. Constitutional Well developed. Well nourished.  Vascular Dorsalis pedis pulses palpable bilaterally. Posterior tibial pulses palpable bilaterally. Capillary refill normal to all digits.  No cyanosis or clubbing noted. Pedal hair growth normal.  Neurologic Normal speech. Oriented to person, place, and time. Epicritic sensation to light touch grossly present bilaterally.  Dermatologic  no further hyperkeratotic lesion noted to left submetatarsal 2 with central nucleated core.  No pain on palpation to the lesion.  Orthopedic: Normal joint ROM without pain or crepitus bilaterally. No visible deformities. No bony tenderness.   Radiographs: None Assessment:   1. Benign skin lesion   2. Porokeratosis  Plan:  Patient was evaluated and treated and all questions answered.  Left submetatarsal 2 porokeratosis/benign skin lesion with underlying capsulitis -Clinically completely healed.  No signs of porokeratosis noted at this time/benign skin lesion.  I discussed with the patient that she will eventually benefit from offloading it however if this comes back we will discuss orthotics management.  She states understanding will continue to do that.  No follow-ups on file.

## 2020-10-09 MED FILL — busPIRone HCL 5 MG TABS: 5 | 45 days supply | Qty: 180 | Fill #0

## 2020-10-12 MED FILL — TRETINOIN 0.1 % CREA: 0.1 | 30 days supply | Qty: 45 | Fill #0

## 2020-10-27 ENCOUNTER — Other Ambulatory Visit (HOSPITAL_COMMUNITY): Payer: Self-pay | Admitting: Internal Medicine

## 2020-10-27 MED FILL — ALPRAZolam 0.5 MG TABS: 0.5 | 30 days supply | Qty: 30 | Fill #0

## 2020-11-18 MED FILL — busPIRone HCL 5 MG TABS: 5 | 45 days supply | Qty: 180 | Fill #1

## 2020-11-23 ENCOUNTER — Ambulatory Visit: Payer: No Typology Code available for payment source | Admitting: Podiatry

## 2020-11-30 ENCOUNTER — Ambulatory Visit: Payer: No Typology Code available for payment source | Admitting: Podiatry

## 2020-11-30 ENCOUNTER — Encounter: Payer: Self-pay | Admitting: Podiatry

## 2020-12-11 ENCOUNTER — Other Ambulatory Visit (HOSPITAL_COMMUNITY): Payer: Self-pay

## 2020-12-11 ENCOUNTER — Other Ambulatory Visit (HOSPITAL_BASED_OUTPATIENT_CLINIC_OR_DEPARTMENT_OTHER): Payer: Self-pay

## 2020-12-11 MED FILL — valACYclovir HCL 1 GM TABS: 1 | 5 days supply | Qty: 20 | Fill #0

## 2020-12-18 ENCOUNTER — Other Ambulatory Visit (HOSPITAL_COMMUNITY): Payer: Self-pay | Admitting: Internal Medicine

## 2021-01-07 ENCOUNTER — Other Ambulatory Visit (HOSPITAL_COMMUNITY): Payer: Self-pay

## 2021-01-07 MED FILL — Drospirenone-Ethinyl Estradiol Tab 3-0.02 MG: ORAL | 84 days supply | Qty: 112 | Fill #0 | Status: AC

## 2021-01-08 ENCOUNTER — Other Ambulatory Visit (HOSPITAL_COMMUNITY): Payer: Self-pay

## 2021-01-08 MED FILL — Buspirone HCl Tab 5 MG: ORAL | 45 days supply | Qty: 180 | Fill #0 | Status: CN

## 2021-01-08 MED FILL — Buspirone HCl Tab 10 MG: ORAL | 30 days supply | Qty: 90 | Fill #0 | Status: AC

## 2021-01-14 ENCOUNTER — Other Ambulatory Visit (HOSPITAL_COMMUNITY): Payer: Self-pay

## 2021-01-14 MED ORDER — ZOLPIDEM TARTRATE 10 MG PO TABS
10.0000 mg | ORAL_TABLET | Freq: Every day | ORAL | 0 refills | Status: AC
  Filled 2021-01-14: qty 5, 5d supply, fill #0

## 2021-02-16 ENCOUNTER — Other Ambulatory Visit (HOSPITAL_COMMUNITY): Payer: Self-pay

## 2021-02-18 ENCOUNTER — Other Ambulatory Visit (HOSPITAL_COMMUNITY): Payer: Self-pay

## 2021-02-18 MED ORDER — BUSPIRONE HCL 10 MG PO TABS
10.0000 mg | ORAL_TABLET | Freq: Three times a day (TID) | ORAL | 0 refills | Status: DC
  Filled 2021-02-18: qty 90, 30d supply, fill #0

## 2021-03-02 ENCOUNTER — Other Ambulatory Visit (HOSPITAL_COMMUNITY): Payer: Self-pay

## 2021-03-09 ENCOUNTER — Other Ambulatory Visit (HOSPITAL_COMMUNITY): Payer: Self-pay

## 2021-03-09 MED ORDER — TRINTELLIX 10 MG PO TABS
10.0000 mg | ORAL_TABLET | Freq: Every day | ORAL | 3 refills | Status: AC
  Filled 2021-03-09: qty 90, 90d supply, fill #0

## 2021-03-17 ENCOUNTER — Other Ambulatory Visit (HOSPITAL_COMMUNITY): Payer: Self-pay

## 2021-04-22 ENCOUNTER — Other Ambulatory Visit (HOSPITAL_COMMUNITY): Payer: Self-pay

## 2021-04-22 MED ORDER — DROSPIRENONE-ETHINYL ESTRADIOL 3-0.02 MG PO TABS
1.0000 | ORAL_TABLET | Freq: Every day | ORAL | 4 refills | Status: AC
  Filled 2021-04-22: qty 84, 84d supply, fill #0
  Filled 2021-06-29: qty 84, 84d supply, fill #1
  Filled 2021-09-27: qty 84, 84d supply, fill #2
  Filled 2021-09-27: qty 84, 72d supply, fill #2
  Filled 2021-12-09: qty 84, 72d supply, fill #3

## 2021-04-26 ENCOUNTER — Other Ambulatory Visit (HOSPITAL_COMMUNITY): Payer: Self-pay

## 2021-04-26 MED ORDER — BUSPIRONE HCL 10 MG PO TABS
10.0000 mg | ORAL_TABLET | Freq: Three times a day (TID) | ORAL | 2 refills | Status: AC
  Filled 2021-04-26: qty 90, 30d supply, fill #0
  Filled 2021-06-10: qty 90, 30d supply, fill #1
  Filled 2021-07-19: qty 90, 30d supply, fill #2

## 2021-04-27 ENCOUNTER — Other Ambulatory Visit (HOSPITAL_COMMUNITY): Payer: Self-pay

## 2021-05-11 ENCOUNTER — Other Ambulatory Visit (HOSPITAL_COMMUNITY): Payer: Self-pay

## 2021-05-11 MED ORDER — TRIAMCINOLONE ACETONIDE 0.1 % EX CREA
TOPICAL_CREAM | CUTANEOUS | 2 refills | Status: AC
  Filled 2021-05-11: qty 80, 30d supply, fill #0
  Filled 2021-12-09: qty 80, 30d supply, fill #1
  Filled 2022-04-07 – 2022-04-27 (×2): qty 80, 30d supply, fill #2

## 2021-06-10 ENCOUNTER — Other Ambulatory Visit (HOSPITAL_COMMUNITY): Payer: Self-pay

## 2021-06-11 ENCOUNTER — Other Ambulatory Visit (HOSPITAL_COMMUNITY): Payer: Self-pay

## 2021-06-14 ENCOUNTER — Other Ambulatory Visit (HOSPITAL_COMMUNITY): Payer: Self-pay

## 2021-06-16 ENCOUNTER — Other Ambulatory Visit (HOSPITAL_COMMUNITY): Payer: Self-pay

## 2021-06-18 ENCOUNTER — Other Ambulatory Visit (HOSPITAL_COMMUNITY): Payer: Self-pay

## 2021-06-21 ENCOUNTER — Other Ambulatory Visit (HOSPITAL_COMMUNITY): Payer: Self-pay

## 2021-06-21 MED ORDER — TRETINOIN 0.1 % EX CREA
1.0000 "application " | TOPICAL_CREAM | Freq: Every day | CUTANEOUS | 2 refills | Status: AC
  Filled 2021-06-21 – 2021-06-28 (×2): qty 45, 30d supply, fill #0
  Filled 2022-04-28: qty 45, 30d supply, fill #1

## 2021-06-21 MED ORDER — HYDROQUINONE 4 % EX CREA
TOPICAL_CREAM | CUTANEOUS | 1 refills | Status: AC
  Filled 2021-06-21: qty 28.35, 14d supply, fill #0
  Filled 2021-07-19: qty 28.35, 14d supply, fill #1

## 2021-06-28 ENCOUNTER — Other Ambulatory Visit (HOSPITAL_COMMUNITY): Payer: Self-pay

## 2021-06-29 ENCOUNTER — Other Ambulatory Visit (HOSPITAL_COMMUNITY): Payer: Self-pay

## 2021-06-30 ENCOUNTER — Other Ambulatory Visit (HOSPITAL_COMMUNITY): Payer: Self-pay

## 2021-07-01 ENCOUNTER — Other Ambulatory Visit (HOSPITAL_COMMUNITY): Payer: Self-pay

## 2021-07-01 MED ORDER — ALPRAZOLAM 0.5 MG PO TABS
0.5000 mg | ORAL_TABLET | Freq: Every day | ORAL | 0 refills | Status: AC | PRN
  Filled 2021-07-01 – 2021-07-19 (×2): qty 30, 30d supply, fill #0

## 2021-07-02 ENCOUNTER — Other Ambulatory Visit (HOSPITAL_COMMUNITY): Payer: Self-pay

## 2021-07-12 ENCOUNTER — Other Ambulatory Visit (HOSPITAL_COMMUNITY): Payer: Self-pay

## 2021-07-19 ENCOUNTER — Other Ambulatory Visit (HOSPITAL_COMMUNITY): Payer: Self-pay

## 2021-07-27 ENCOUNTER — Other Ambulatory Visit (HOSPITAL_COMMUNITY): Payer: Self-pay

## 2021-07-27 MED ORDER — TACROLIMUS 0.03 % EX OINT
TOPICAL_OINTMENT | Freq: Two times a day (BID) | CUTANEOUS | 0 refills | Status: AC
  Filled 2021-07-27 – 2021-07-30 (×2): qty 60, 30d supply, fill #0

## 2021-07-29 ENCOUNTER — Other Ambulatory Visit (HOSPITAL_COMMUNITY): Payer: Self-pay

## 2021-07-30 ENCOUNTER — Other Ambulatory Visit (HOSPITAL_COMMUNITY): Payer: Self-pay

## 2021-08-09 ENCOUNTER — Other Ambulatory Visit (HOSPITAL_COMMUNITY): Payer: Self-pay

## 2021-08-09 MED ORDER — BUPROPION HCL ER (XL) 150 MG PO TB24
150.0000 mg | ORAL_TABLET | Freq: Every morning | ORAL | 1 refills | Status: DC
  Filled 2021-08-09: qty 90, 90d supply, fill #0
  Filled 2021-11-05: qty 90, 90d supply, fill #1

## 2021-09-27 ENCOUNTER — Other Ambulatory Visit (HOSPITAL_COMMUNITY): Payer: Self-pay

## 2021-11-05 ENCOUNTER — Other Ambulatory Visit (HOSPITAL_COMMUNITY): Payer: Self-pay

## 2021-12-10 ENCOUNTER — Other Ambulatory Visit (HOSPITAL_COMMUNITY): Payer: Self-pay

## 2021-12-10 MED FILL — Valacyclovir HCl Tab 1 GM: ORAL | 5 days supply | Qty: 20 | Fill #0 | Status: AC

## 2022-01-11 ENCOUNTER — Other Ambulatory Visit (HOSPITAL_COMMUNITY): Payer: Self-pay

## 2022-01-11 MED ORDER — PREDNISONE 10 MG PO TABS
ORAL_TABLET | ORAL | 0 refills | Status: AC
  Filled 2022-01-11: qty 56, 14d supply, fill #0

## 2022-01-12 ENCOUNTER — Other Ambulatory Visit (HOSPITAL_COMMUNITY): Payer: Self-pay

## 2022-01-12 MED ORDER — BUPROPION HCL ER (XL) 150 MG PO TB24
150.0000 mg | ORAL_TABLET | Freq: Every morning | ORAL | 1 refills | Status: AC
  Filled 2022-01-12: qty 90, 90d supply, fill #0

## 2022-02-04 ENCOUNTER — Other Ambulatory Visit (HOSPITAL_COMMUNITY): Payer: Self-pay

## 2022-03-17 ENCOUNTER — Other Ambulatory Visit (HOSPITAL_COMMUNITY): Payer: Self-pay

## 2022-03-17 MED ORDER — DROSPIRENONE-ETHINYL ESTRADIOL 3-0.02 MG PO TABS
1.0000 | ORAL_TABLET | Freq: Every day | ORAL | 1 refills | Status: DC
  Filled 2022-03-17: qty 84, 84d supply, fill #0
  Filled 2022-05-24: qty 84, 84d supply, fill #1

## 2022-04-07 ENCOUNTER — Other Ambulatory Visit (HOSPITAL_COMMUNITY): Payer: Self-pay

## 2022-04-21 ENCOUNTER — Other Ambulatory Visit (HOSPITAL_COMMUNITY): Payer: Self-pay

## 2022-04-27 ENCOUNTER — Other Ambulatory Visit (HOSPITAL_COMMUNITY): Payer: Self-pay

## 2022-04-27 MED ORDER — ALPRAZOLAM 0.5 MG PO TABS
0.5000 mg | ORAL_TABLET | Freq: Every day | ORAL | 0 refills | Status: AC | PRN
  Filled 2022-04-27: qty 30, 30d supply, fill #0

## 2022-04-27 MED ORDER — PROPRANOLOL HCL 10 MG PO TABS
10.0000 mg | ORAL_TABLET | Freq: Two times a day (BID) | ORAL | 0 refills | Status: AC | PRN
  Filled 2022-04-27: qty 30, 15d supply, fill #0

## 2022-04-29 ENCOUNTER — Other Ambulatory Visit (HOSPITAL_COMMUNITY): Payer: Self-pay

## 2022-05-24 ENCOUNTER — Other Ambulatory Visit (HOSPITAL_COMMUNITY): Payer: Self-pay

## 2022-07-06 ENCOUNTER — Other Ambulatory Visit (HOSPITAL_COMMUNITY): Payer: Self-pay

## 2022-07-06 MED ORDER — DROSPIRENONE-ETHINYL ESTRADIOL 3-0.02 MG PO TABS
1.0000 | ORAL_TABLET | Freq: Every day | ORAL | 4 refills | Status: AC
  Filled 2022-07-06 – 2022-08-07 (×2): qty 112, 84d supply, fill #0
  Filled 2022-11-08: qty 112, 84d supply, fill #1
  Filled 2022-11-14: qty 84, 72d supply, fill #1
  Filled 2023-01-15: qty 84, 72d supply, fill #2
  Filled 2023-04-06: qty 84, 72d supply, fill #3
  Filled 2023-06-17: qty 84, 72d supply, fill #4

## 2022-07-27 ENCOUNTER — Other Ambulatory Visit (HOSPITAL_COMMUNITY): Payer: Self-pay

## 2022-07-27 MED ORDER — SLYND 4 MG PO TABS
1.0000 | ORAL_TABLET | Freq: Every day | ORAL | 3 refills | Status: AC
  Filled 2022-07-27: qty 84, 84d supply, fill #0

## 2022-08-05 ENCOUNTER — Other Ambulatory Visit (HOSPITAL_COMMUNITY): Payer: Self-pay

## 2022-08-08 ENCOUNTER — Other Ambulatory Visit (HOSPITAL_COMMUNITY): Payer: Self-pay

## 2022-08-22 ENCOUNTER — Other Ambulatory Visit (HOSPITAL_COMMUNITY): Payer: Self-pay

## 2022-08-22 MED ORDER — RIZATRIPTAN BENZOATE 10 MG PO TABS
10.0000 mg | ORAL_TABLET | ORAL | 5 refills | Status: AC
  Filled 2022-08-22: qty 12, 30d supply, fill #0

## 2022-08-22 MED ORDER — ONDANSETRON HCL 8 MG PO TABS
8.0000 mg | ORAL_TABLET | Freq: Three times a day (TID) | ORAL | 0 refills | Status: AC | PRN
  Filled 2022-08-22: qty 30, 10d supply, fill #0

## 2022-11-14 ENCOUNTER — Other Ambulatory Visit (HOSPITAL_COMMUNITY): Payer: Self-pay

## 2022-11-14 ENCOUNTER — Other Ambulatory Visit (HOSPITAL_BASED_OUTPATIENT_CLINIC_OR_DEPARTMENT_OTHER): Payer: Self-pay

## 2023-01-15 ENCOUNTER — Other Ambulatory Visit (HOSPITAL_COMMUNITY): Payer: Self-pay

## 2023-01-16 ENCOUNTER — Other Ambulatory Visit: Payer: Self-pay

## 2023-01-16 ENCOUNTER — Other Ambulatory Visit (HOSPITAL_COMMUNITY): Payer: Self-pay

## 2023-01-18 ENCOUNTER — Other Ambulatory Visit (HOSPITAL_COMMUNITY): Payer: Self-pay

## 2023-01-19 ENCOUNTER — Other Ambulatory Visit (HOSPITAL_COMMUNITY): Payer: Self-pay

## 2023-01-20 ENCOUNTER — Other Ambulatory Visit (HOSPITAL_COMMUNITY): Payer: Self-pay

## 2023-02-02 ENCOUNTER — Other Ambulatory Visit (HOSPITAL_COMMUNITY): Payer: Self-pay

## 2023-04-07 ENCOUNTER — Other Ambulatory Visit (HOSPITAL_COMMUNITY): Payer: Self-pay

## 2023-04-20 ENCOUNTER — Other Ambulatory Visit (HOSPITAL_COMMUNITY): Payer: Self-pay

## 2023-08-14 ENCOUNTER — Other Ambulatory Visit: Payer: Self-pay | Admitting: Obstetrics & Gynecology

## 2023-08-14 DIAGNOSIS — R92343 Mammographic extreme density, bilateral breasts: Secondary | ICD-10-CM

## 2023-08-30 ENCOUNTER — Encounter: Payer: Self-pay | Admitting: Obstetrics & Gynecology

## 2023-10-31 ENCOUNTER — Other Ambulatory Visit (HOSPITAL_COMMUNITY): Payer: Self-pay

## 2023-10-31 MED ORDER — URIBEL 118 MG PO CAPS
1.0000 | ORAL_CAPSULE | Freq: Four times a day (QID) | ORAL | 0 refills | Status: AC
  Filled 2023-10-31: qty 8, 2d supply, fill #0

## 2023-10-31 MED ORDER — CEPHALEXIN 250 MG PO CAPS
250.0000 mg | ORAL_CAPSULE | Freq: Two times a day (BID) | ORAL | 0 refills | Status: AC
  Filled 2023-10-31: qty 14, 7d supply, fill #0

## 2024-01-19 ENCOUNTER — Encounter: Payer: Self-pay | Admitting: Obstetrics & Gynecology

## 2024-01-22 ENCOUNTER — Encounter: Payer: Self-pay | Admitting: Obstetrics & Gynecology

## 2024-01-26 ENCOUNTER — Encounter: Payer: Self-pay | Admitting: Obstetrics & Gynecology

## 2024-02-01 ENCOUNTER — Ambulatory Visit
Admission: RE | Admit: 2024-02-01 | Discharge: 2024-02-01 | Disposition: A | Discharge status: Home / Self Care | Payer: Self-pay | Source: Ambulatory Visit | Attending: Obstetrics & Gynecology | Admitting: Obstetrics & Gynecology

## 2024-02-01 ENCOUNTER — Inpatient Hospital Stay: Admission: RE | Admit: 2024-02-01 | Payer: Self-pay | Source: Ambulatory Visit

## 2024-02-01 DIAGNOSIS — R92343 Mammographic extreme density, bilateral breasts: Secondary | ICD-10-CM

## 2024-02-01 MED ORDER — GADOPICLENOL 0.5 MMOL/ML IV SOLN
6.0000 mL | Freq: Once | INTRAVENOUS | Status: AC | PRN
  Administered 2024-02-01: 6 mL via INTRAVENOUS

## 2024-02-02 ENCOUNTER — Other Ambulatory Visit: Payer: Self-pay | Admitting: Obstetrics & Gynecology

## 2024-02-02 DIAGNOSIS — N6320 Unspecified lump in the left breast, unspecified quadrant: Secondary | ICD-10-CM

## 2024-02-19 ENCOUNTER — Ambulatory Visit
Admission: RE | Admit: 2024-02-19 | Discharge: 2024-02-19 | Disposition: A | Discharge status: Home / Self Care | Source: Ambulatory Visit | Attending: Obstetrics & Gynecology | Admitting: Obstetrics & Gynecology

## 2024-02-19 ENCOUNTER — Other Ambulatory Visit: Payer: Self-pay | Admitting: Obstetrics & Gynecology

## 2024-02-19 DIAGNOSIS — N6320 Unspecified lump in the left breast, unspecified quadrant: Secondary | ICD-10-CM

## 2024-02-19 HISTORY — PX: BREAST BIOPSY: SHX20

## 2024-02-20 LAB — SURGICAL PATHOLOGY

## 2024-07-02 ENCOUNTER — Other Ambulatory Visit: Payer: Self-pay | Admitting: Family Medicine

## 2024-07-02 DIAGNOSIS — R590 Localized enlarged lymph nodes: Secondary | ICD-10-CM

## 2024-07-03 ENCOUNTER — Ambulatory Visit
Admission: RE | Admit: 2024-07-03 | Discharge: 2024-07-03 | Disposition: A | Discharge status: Home / Self Care | Source: Ambulatory Visit | Attending: Family Medicine | Admitting: Family Medicine

## 2024-07-03 DIAGNOSIS — R590 Localized enlarged lymph nodes: Secondary | ICD-10-CM
